# Patient Record
Sex: Female | Born: 1975 | Race: White | Hispanic: No | Smoking: Current every day smoker
Health system: Southern US, Community
[De-identification: ages and names within clinical notes are randomized; demographics above are authoritative.]

## PROBLEM LIST (undated history)

## (undated) DIAGNOSIS — J349 Unspecified disorder of nose and nasal sinuses: Secondary | ICD-10-CM

## (undated) DIAGNOSIS — N12 Tubulo-interstitial nephritis, not specified as acute or chronic: Secondary | ICD-10-CM

## (undated) DIAGNOSIS — R519 Headache, unspecified: Secondary | ICD-10-CM

## (undated) DIAGNOSIS — F410 Panic disorder [episodic paroxysmal anxiety] without agoraphobia: Secondary | ICD-10-CM

## (undated) DIAGNOSIS — N83209 Unspecified ovarian cyst, unspecified side: Secondary | ICD-10-CM

## (undated) DIAGNOSIS — J449 Chronic obstructive pulmonary disease, unspecified: Secondary | ICD-10-CM

## (undated) DIAGNOSIS — Z87891 Personal history of nicotine dependence: Secondary | ICD-10-CM

## (undated) DIAGNOSIS — I471 Supraventricular tachycardia: Secondary | ICD-10-CM

## (undated) DIAGNOSIS — Z9289 Personal history of other medical treatment: Secondary | ICD-10-CM

## (undated) DIAGNOSIS — F329 Major depressive disorder, single episode, unspecified: Secondary | ICD-10-CM

## (undated) DIAGNOSIS — I4719 Other supraventricular tachycardia: Secondary | ICD-10-CM

## (undated) DIAGNOSIS — N3010 Interstitial cystitis (chronic) without hematuria: Secondary | ICD-10-CM

## (undated) DIAGNOSIS — R001 Bradycardia, unspecified: Secondary | ICD-10-CM

## (undated) DIAGNOSIS — R51 Headache: Secondary | ICD-10-CM

## (undated) DIAGNOSIS — Z22322 Carrier or suspected carrier of Methicillin resistant Staphylococcus aureus: Secondary | ICD-10-CM

## (undated) HISTORY — DX: Bradycardia, unspecified: R00.1

## (undated) HISTORY — DX: Personal history of nicotine dependence: Z87.891

## (undated) HISTORY — DX: Personal history of other medical treatment: Z92.89

## (undated) HISTORY — PX: LAPAROSCOPY: SHX197

## (undated) HISTORY — DX: Other supraventricular tachycardia: I47.19

## (undated) HISTORY — PX: APPENDECTOMY: SHX54

## (undated) HISTORY — DX: Panic disorder (episodic paroxysmal anxiety): F41.0

## (undated) HISTORY — DX: Unspecified disorder of nose and nasal sinuses: J34.9

## (undated) HISTORY — DX: Tubulo-interstitial nephritis, not specified as acute or chronic: N12

## (undated) HISTORY — DX: Interstitial cystitis (chronic) without hematuria: N30.10

## (undated) HISTORY — DX: Unspecified ovarian cyst, unspecified side: N83.209

## (undated) HISTORY — DX: Supraventricular tachycardia: I47.1

## (undated) HISTORY — DX: Carrier or suspected carrier of methicillin resistant Staphylococcus aureus: Z22.322

---

## 2006-03-30 ENCOUNTER — Ambulatory Visit: Payer: Self-pay | Admitting: Family Medicine

## 2006-11-16 ENCOUNTER — Ambulatory Visit: Payer: Self-pay | Admitting: Family Medicine

## 2006-11-26 ENCOUNTER — Ambulatory Visit: Payer: Self-pay | Admitting: Family Medicine

## 2006-11-26 ENCOUNTER — Ambulatory Visit (HOSPITAL_COMMUNITY): Admission: RE | Admit: 2006-11-26 | Discharge: 2006-11-26 | Payer: Self-pay | Admitting: Family Medicine

## 2006-12-14 ENCOUNTER — Encounter: Payer: Self-pay | Admitting: Family Medicine

## 2006-12-14 ENCOUNTER — Ambulatory Visit: Payer: Self-pay | Admitting: Family Medicine

## 2007-02-18 ENCOUNTER — Ambulatory Visit (HOSPITAL_COMMUNITY): Admission: RE | Admit: 2007-02-18 | Discharge: 2007-02-18 | Payer: Self-pay | Admitting: Gynecology

## 2007-02-18 ENCOUNTER — Ambulatory Visit: Payer: Self-pay | Admitting: Gynecology

## 2007-02-22 ENCOUNTER — Ambulatory Visit: Payer: Self-pay | Admitting: Family Medicine

## 2007-03-04 ENCOUNTER — Ambulatory Visit (HOSPITAL_COMMUNITY): Admission: RE | Admit: 2007-03-04 | Discharge: 2007-03-04 | Payer: Self-pay | Admitting: Obstetrics & Gynecology

## 2007-03-15 ENCOUNTER — Ambulatory Visit: Payer: Self-pay | Admitting: Family Medicine

## 2007-03-29 ENCOUNTER — Ambulatory Visit: Payer: Self-pay | Admitting: Family Medicine

## 2007-04-08 ENCOUNTER — Ambulatory Visit (HOSPITAL_COMMUNITY): Admission: RE | Admit: 2007-04-08 | Discharge: 2007-04-08 | Payer: Self-pay | Admitting: Family Medicine

## 2007-04-12 ENCOUNTER — Ambulatory Visit: Payer: Self-pay | Admitting: Family Medicine

## 2007-05-03 ENCOUNTER — Ambulatory Visit: Payer: Self-pay | Admitting: Family Medicine

## 2007-05-06 ENCOUNTER — Ambulatory Visit (HOSPITAL_COMMUNITY): Admission: RE | Admit: 2007-05-06 | Discharge: 2007-05-06 | Payer: Self-pay | Admitting: Family Medicine

## 2007-05-20 ENCOUNTER — Ambulatory Visit (HOSPITAL_COMMUNITY): Admission: RE | Admit: 2007-05-20 | Discharge: 2007-05-20 | Payer: Self-pay | Admitting: Gynecology

## 2007-05-24 ENCOUNTER — Ambulatory Visit: Payer: Self-pay | Admitting: Family Medicine

## 2007-06-03 ENCOUNTER — Ambulatory Visit (HOSPITAL_COMMUNITY): Admission: RE | Admit: 2007-06-03 | Discharge: 2007-06-03 | Payer: Self-pay | Admitting: Gynecology

## 2007-06-21 ENCOUNTER — Ambulatory Visit: Payer: Self-pay | Admitting: Family Medicine

## 2007-07-12 ENCOUNTER — Ambulatory Visit: Payer: Self-pay | Admitting: Family Medicine

## 2007-08-02 ENCOUNTER — Ambulatory Visit: Payer: Self-pay | Admitting: Family Medicine

## 2007-08-05 ENCOUNTER — Encounter: Admission: RE | Admit: 2007-08-05 | Discharge: 2007-08-05 | Payer: Self-pay | Admitting: Obstetrics & Gynecology

## 2007-08-09 ENCOUNTER — Ambulatory Visit: Payer: Self-pay | Admitting: Family Medicine

## 2007-08-23 ENCOUNTER — Ambulatory Visit: Payer: Self-pay | Admitting: Family Medicine

## 2007-08-26 ENCOUNTER — Ambulatory Visit (HOSPITAL_COMMUNITY): Admission: RE | Admit: 2007-08-26 | Discharge: 2007-08-26 | Payer: Self-pay | Admitting: Obstetrics & Gynecology

## 2007-08-29 ENCOUNTER — Ambulatory Visit: Payer: Self-pay | Admitting: Gynecology

## 2007-09-01 ENCOUNTER — Ambulatory Visit: Payer: Self-pay | Admitting: Obstetrics & Gynecology

## 2007-09-07 ENCOUNTER — Ambulatory Visit: Payer: Self-pay | Admitting: Obstetrics & Gynecology

## 2007-09-12 ENCOUNTER — Ambulatory Visit: Payer: Self-pay | Admitting: Obstetrics & Gynecology

## 2007-09-13 ENCOUNTER — Encounter: Payer: Self-pay | Admitting: Family Medicine

## 2007-09-13 LAB — CONVERTED CEMR LAB: Chlamydia, DNA Probe: NEGATIVE

## 2007-09-19 ENCOUNTER — Ambulatory Visit: Payer: Self-pay | Admitting: Gynecology

## 2007-09-26 ENCOUNTER — Ambulatory Visit: Payer: Self-pay | Admitting: Gynecology

## 2007-10-03 ENCOUNTER — Ambulatory Visit: Payer: Self-pay | Admitting: Obstetrics & Gynecology

## 2007-10-10 ENCOUNTER — Ambulatory Visit: Payer: Self-pay | Admitting: Obstetrics & Gynecology

## 2007-10-10 ENCOUNTER — Inpatient Hospital Stay (HOSPITAL_COMMUNITY): Admission: RE | Admit: 2007-10-10 | Discharge: 2007-10-13 | Payer: Self-pay | Admitting: Gynecology

## 2007-10-17 ENCOUNTER — Ambulatory Visit: Payer: Self-pay | Admitting: Cardiology

## 2007-10-17 ENCOUNTER — Ambulatory Visit: Payer: Self-pay | Admitting: Obstetrics and Gynecology

## 2007-10-17 ENCOUNTER — Encounter: Payer: Self-pay | Admitting: Obstetrics & Gynecology

## 2007-10-17 ENCOUNTER — Observation Stay (HOSPITAL_COMMUNITY): Admission: EM | Admit: 2007-10-17 | Discharge: 2007-10-19 | Payer: Self-pay | Admitting: Cardiology

## 2007-10-18 ENCOUNTER — Encounter: Payer: Self-pay | Admitting: Cardiology

## 2007-11-16 ENCOUNTER — Ambulatory Visit: Payer: Self-pay | Admitting: Cardiology

## 2007-11-23 ENCOUNTER — Ambulatory Visit: Payer: Self-pay | Admitting: Gynecology

## 2007-11-23 ENCOUNTER — Encounter (INDEPENDENT_AMBULATORY_CARE_PROVIDER_SITE_OTHER): Payer: Self-pay | Admitting: Gynecology

## 2008-05-24 ENCOUNTER — Ambulatory Visit: Payer: Self-pay | Admitting: Cardiology

## 2008-06-22 ENCOUNTER — Inpatient Hospital Stay (HOSPITAL_COMMUNITY): Admission: AD | Admit: 2008-06-22 | Discharge: 2008-06-22 | Payer: Self-pay | Admitting: Obstetrics & Gynecology

## 2008-10-11 ENCOUNTER — Ambulatory Visit: Payer: Self-pay | Admitting: Obstetrics & Gynecology

## 2008-10-12 ENCOUNTER — Encounter: Payer: Self-pay | Admitting: Family Medicine

## 2008-10-12 LAB — CONVERTED CEMR LAB
Antibody Screen: NEGATIVE
Basophils Absolute: 0 10*3/uL (ref 0.0–0.1)
Eosinophils Absolute: 0 10*3/uL (ref 0.0–0.7)
Eosinophils Relative: 1 % (ref 0–5)
Hepatitis B Surface Ag: NEGATIVE
MCHC: 33.3 g/dL (ref 30.0–36.0)
Monocytes Absolute: 0.4 10*3/uL (ref 0.1–1.0)
Monocytes Relative: 9 % (ref 3–12)
Neutro Abs: 3 10*3/uL (ref 1.7–7.7)
RDW: 12.7 % (ref 11.5–15.5)

## 2008-10-18 ENCOUNTER — Encounter: Payer: Self-pay | Admitting: Family Medicine

## 2008-10-18 ENCOUNTER — Ambulatory Visit: Payer: Self-pay | Admitting: Obstetrics & Gynecology

## 2008-10-18 LAB — CONVERTED CEMR LAB: GC Probe Amp, Urine: NEGATIVE

## 2008-10-29 ENCOUNTER — Ambulatory Visit: Payer: Self-pay | Admitting: Family Medicine

## 2008-11-12 IMAGING — CT CT ANGIO CHEST
3 of 4 series · 18 of 30 positions shown · IV contrast (150ml omni/300%)
Comparison: none

CLINICAL DATA: 31-year-old female, six days postpartum with shortness of breath.
CT ANGIOGRAPHY OF CHEST:
TECHNIQUE: Multidetector CT imaging of the chest was performed during bolus injection of intravenous contrast.  Multiplanar CT angiographic image reconstructions were generated to evaluate the vascular anatomy.
Contrast:  150 cc Omnipaque 300.

[Series 4: recon 3: pe chest · axial · 0.70mm/px · z∈[-270,-30]mm · 12 of 288 slices shown]
[im 24/288  lung]
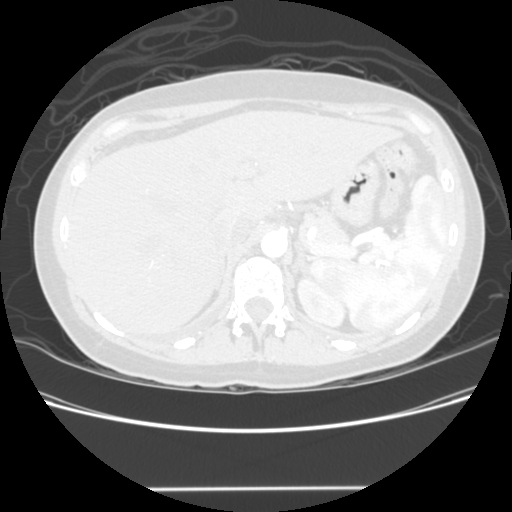
[im 48/288  mediastinal]
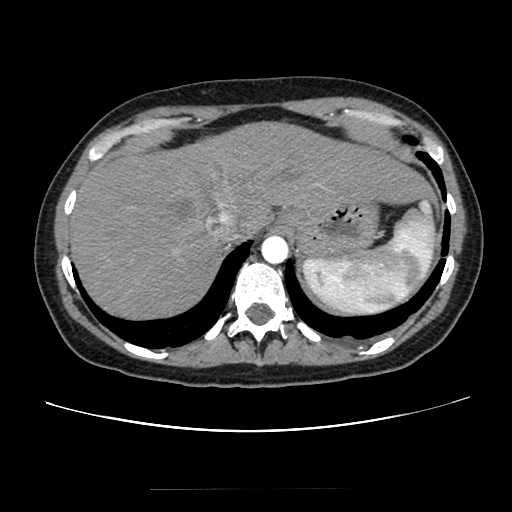
[im 72/288  lung]
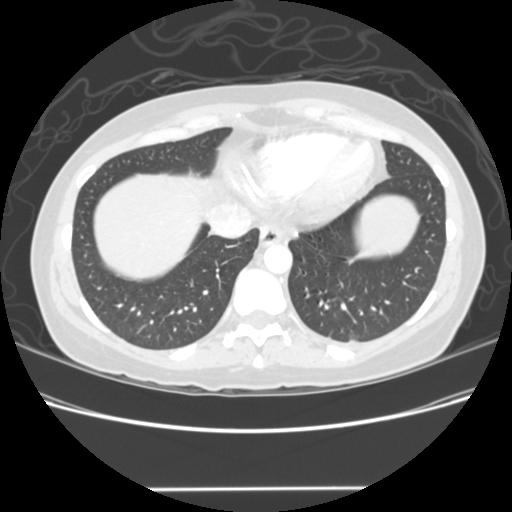
[im 96/288  mediastinal]
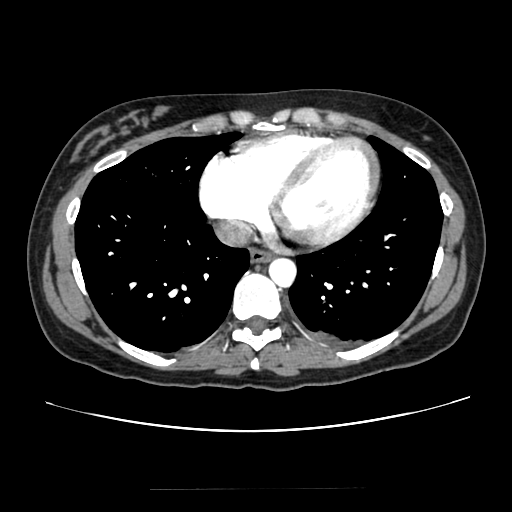
[im 120/288  lung]
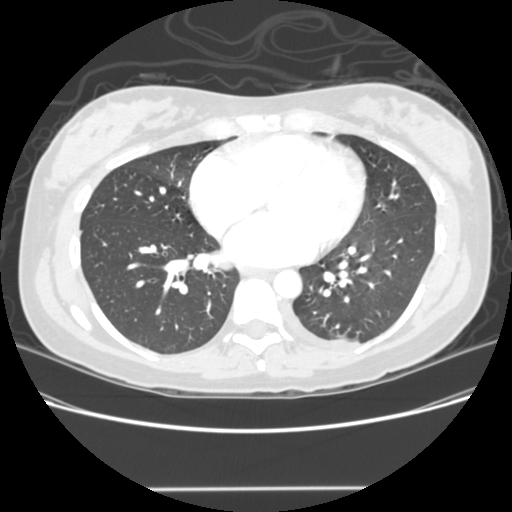
[im 144/288  mediastinal]
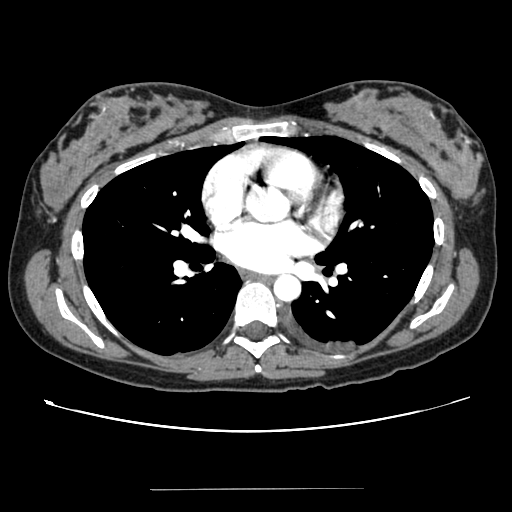
[im 155/288  lung]
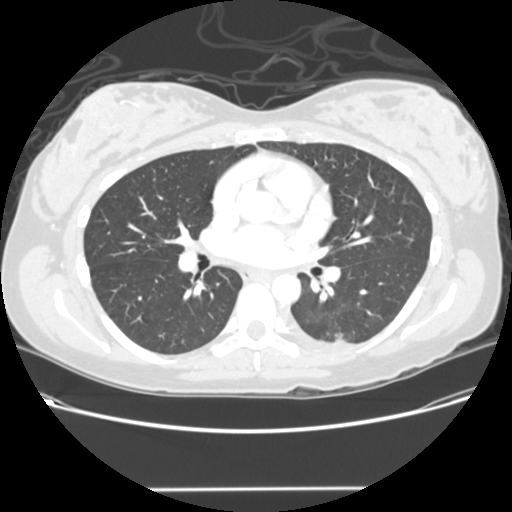
[im 168/288  mediastinal]
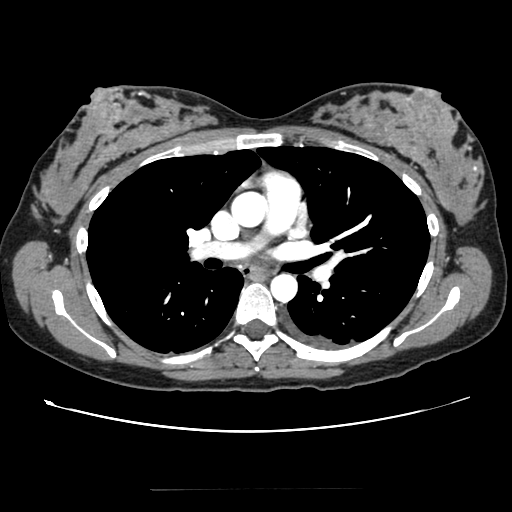
[im 192/288  lung]
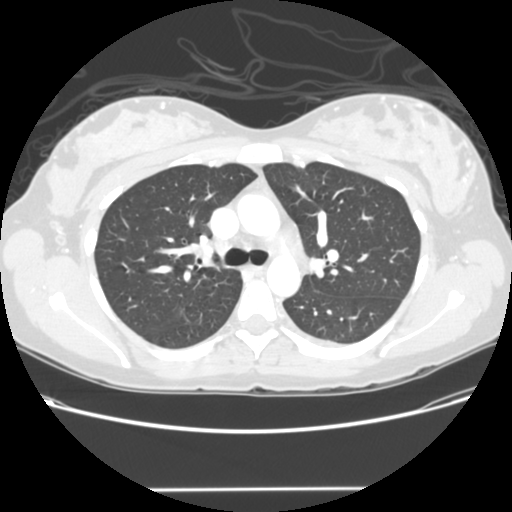
[im 216/288  mediastinal]
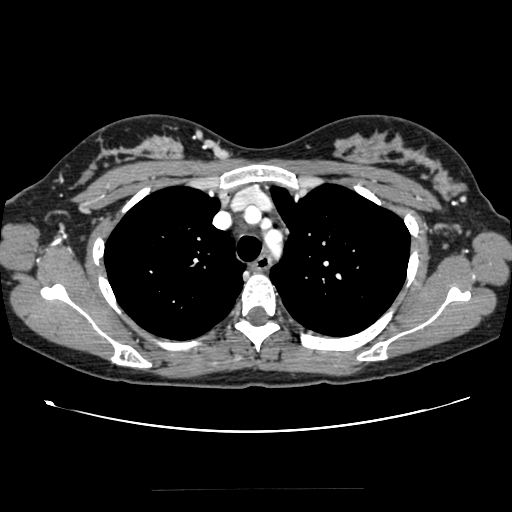
[im 240/288  lung]
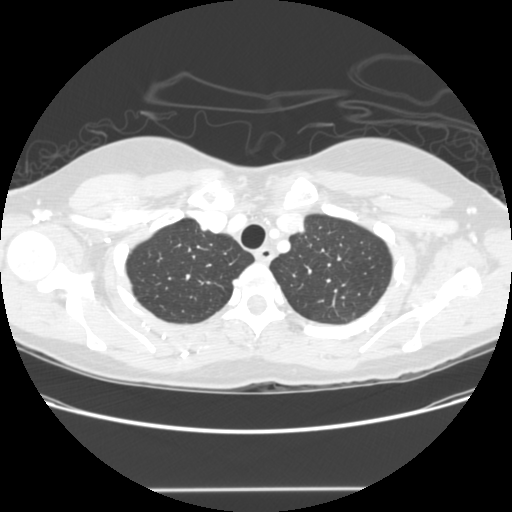
[im 264/288  mediastinal]
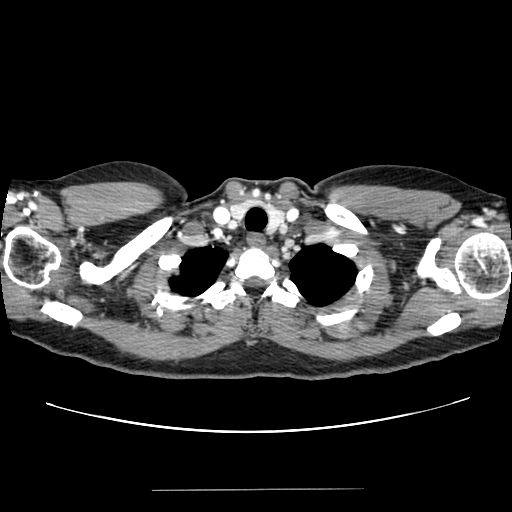

[Series 400: reformatted · coronal · 0.61mm/px · 2 of 102 slices shown (1 of 2)]
[im 34/102  lung]
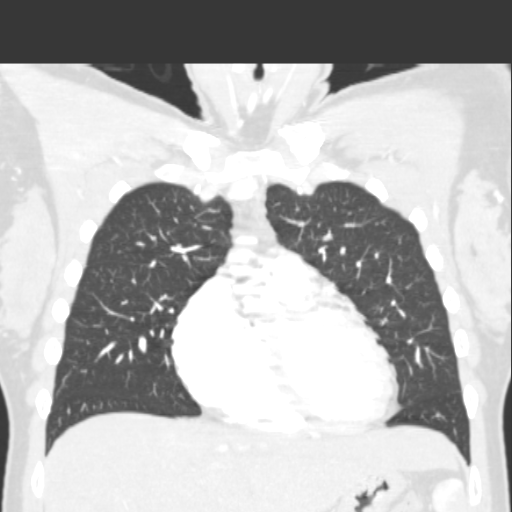
[im 68/102  lung]
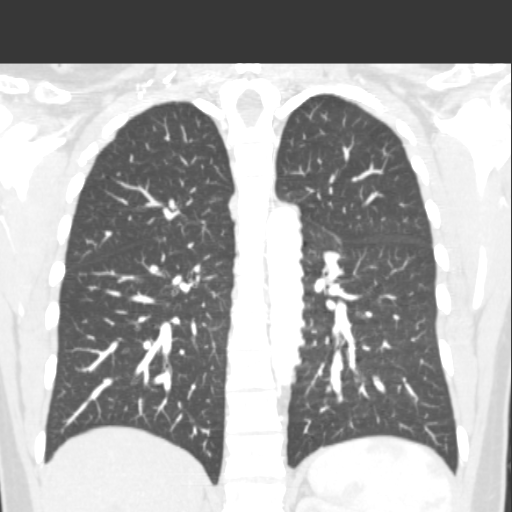

[Series 401: reformatted · sagittal · 0.61mm/px · 4 of 139 slices shown (2 of 2)]
[im 28/139  lung]
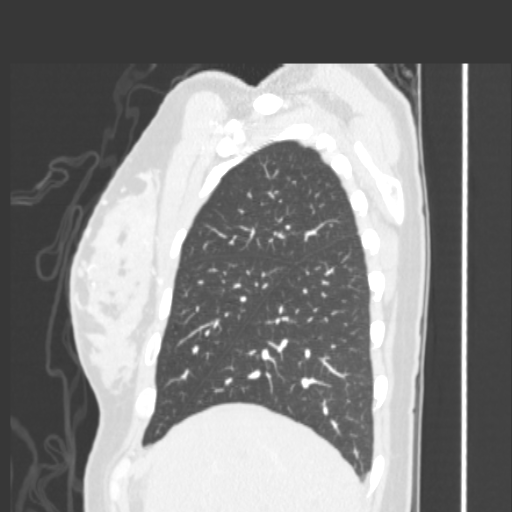
[im 56/139  lung]
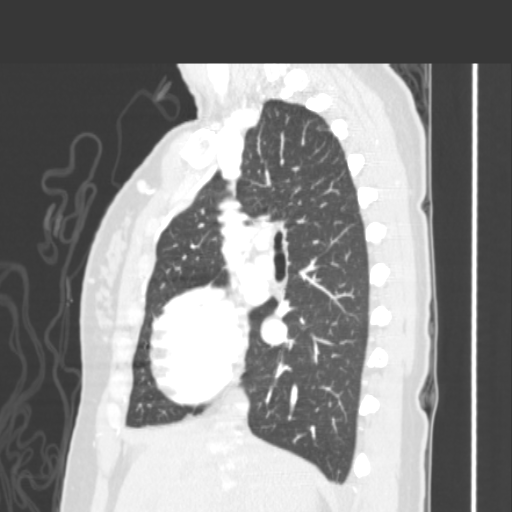
[im 83/139  lung]
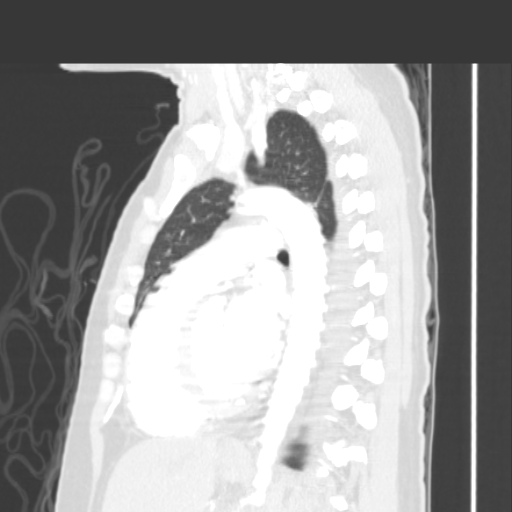
[im 111/139  lung]
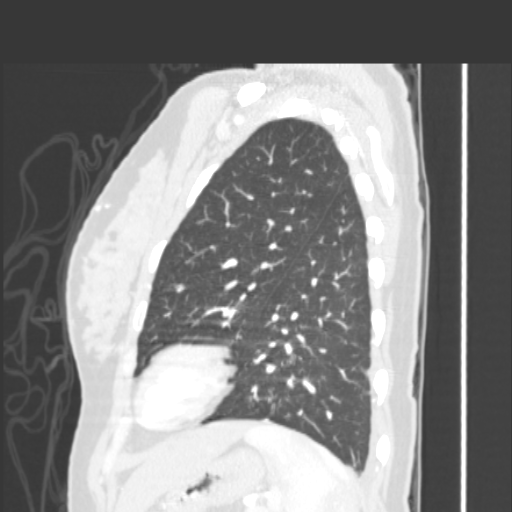

[18 of 30 positions shown; findings below may reference images not displayed]

FINDINGS: The study is technically suboptimal in that the pulmonary arteries are imaged at a delayed time with greater contrast in the aorta.  No evidence of filling defects within the main pulmonary arteries and segmental pulmonary arteries.  The subsegmental arteries are poorly opacified.  
Review of the lung parenchymal demonstrates mild dependent atelectasis at the left lung base and a small left effusion.  No evidence focal consolidation or pneumothorax. 
No evidence of mediastinal or hilar lymphadenopathy.  
Limited view of the upper abdomen is unremarkable.
IMPRESSION: 1.  No evidence of acute pulmonary embolism in study somewhat limited by poor timing. 
2.  Mild left basilar atelectasis and trace effusion.

## 2008-11-15 ENCOUNTER — Encounter: Payer: Self-pay | Admitting: Family Medicine

## 2008-11-15 ENCOUNTER — Ambulatory Visit: Payer: Self-pay | Admitting: Family Medicine

## 2008-11-29 ENCOUNTER — Ambulatory Visit: Payer: Self-pay | Admitting: Family Medicine

## 2008-11-30 ENCOUNTER — Ambulatory Visit (HOSPITAL_COMMUNITY): Admission: RE | Admit: 2008-11-30 | Discharge: 2008-11-30 | Payer: Self-pay | Admitting: Family Medicine

## 2008-12-13 ENCOUNTER — Ambulatory Visit: Payer: Self-pay | Admitting: Family Medicine

## 2008-12-28 ENCOUNTER — Ambulatory Visit (HOSPITAL_COMMUNITY): Admission: RE | Admit: 2008-12-28 | Discharge: 2008-12-28 | Payer: Self-pay | Admitting: Family Medicine

## 2009-01-03 ENCOUNTER — Ambulatory Visit: Payer: Self-pay | Admitting: Obstetrics & Gynecology

## 2009-01-11 ENCOUNTER — Ambulatory Visit (HOSPITAL_COMMUNITY): Admission: RE | Admit: 2009-01-11 | Discharge: 2009-01-11 | Payer: Self-pay | Admitting: Family Medicine

## 2009-01-31 ENCOUNTER — Encounter: Payer: Self-pay | Admitting: Family Medicine

## 2009-01-31 ENCOUNTER — Ambulatory Visit: Payer: Self-pay | Admitting: Obstetrics and Gynecology

## 2009-02-28 ENCOUNTER — Ambulatory Visit: Payer: Self-pay | Admitting: Obstetrics and Gynecology

## 2009-02-28 LAB — CONVERTED CEMR LAB: Yeast Wet Prep HPF POC: NONE SEEN

## 2009-03-21 ENCOUNTER — Encounter: Payer: Self-pay | Admitting: Family Medicine

## 2009-03-21 ENCOUNTER — Ambulatory Visit: Payer: Self-pay | Admitting: Obstetrics & Gynecology

## 2009-03-21 LAB — CONVERTED CEMR LAB
HCT: 33.7 % — ABNORMAL LOW (ref 36.0–46.0)
Hemoglobin: 10.6 g/dL — ABNORMAL LOW (ref 12.0–15.0)
MCHC: 31.5 g/dL (ref 30.0–36.0)
RDW: 14.6 % (ref 11.5–15.5)
WBC: 6.3 10*3/uL (ref 4.0–10.5)

## 2009-04-04 ENCOUNTER — Ambulatory Visit: Payer: Self-pay | Admitting: Obstetrics and Gynecology

## 2009-04-05 ENCOUNTER — Encounter: Payer: Self-pay | Admitting: Family Medicine

## 2009-04-18 ENCOUNTER — Ambulatory Visit: Payer: Self-pay | Admitting: Obstetrics & Gynecology

## 2009-05-02 ENCOUNTER — Ambulatory Visit: Payer: Self-pay | Admitting: Obstetrics and Gynecology

## 2009-05-16 ENCOUNTER — Ambulatory Visit: Payer: Self-pay | Admitting: Obstetrics & Gynecology

## 2009-05-16 ENCOUNTER — Encounter: Payer: Self-pay | Admitting: Obstetrics & Gynecology

## 2009-05-16 LAB — CONVERTED CEMR LAB: Chlamydia, DNA Probe: NEGATIVE

## 2009-05-17 ENCOUNTER — Encounter: Payer: Self-pay | Admitting: Obstetrics & Gynecology

## 2009-05-23 ENCOUNTER — Ambulatory Visit: Payer: Self-pay | Admitting: Obstetrics & Gynecology

## 2009-05-25 ENCOUNTER — Ambulatory Visit: Payer: Self-pay | Admitting: Advanced Practice Midwife

## 2009-05-25 ENCOUNTER — Inpatient Hospital Stay (HOSPITAL_COMMUNITY): Admission: AD | Admit: 2009-05-25 | Discharge: 2009-05-25 | Payer: Self-pay | Admitting: Obstetrics & Gynecology

## 2009-05-30 ENCOUNTER — Ambulatory Visit: Payer: Self-pay | Admitting: Obstetrics & Gynecology

## 2009-06-05 ENCOUNTER — Inpatient Hospital Stay (HOSPITAL_COMMUNITY): Admission: AD | Admit: 2009-06-05 | Discharge: 2009-06-07 | Payer: Self-pay | Admitting: Obstetrics & Gynecology

## 2009-06-05 ENCOUNTER — Ambulatory Visit: Payer: Self-pay | Admitting: Obstetrics and Gynecology

## 2009-06-08 DIAGNOSIS — N12 Tubulo-interstitial nephritis, not specified as acute or chronic: Secondary | ICD-10-CM | POA: Insufficient documentation

## 2009-06-08 DIAGNOSIS — N83209 Unspecified ovarian cyst, unspecified side: Secondary | ICD-10-CM

## 2009-06-08 DIAGNOSIS — N809 Endometriosis, unspecified: Secondary | ICD-10-CM | POA: Insufficient documentation

## 2009-06-08 DIAGNOSIS — Z8659 Personal history of other mental and behavioral disorders: Secondary | ICD-10-CM | POA: Insufficient documentation

## 2009-06-08 DIAGNOSIS — N3010 Interstitial cystitis (chronic) without hematuria: Secondary | ICD-10-CM | POA: Insufficient documentation

## 2009-06-08 DIAGNOSIS — Z87891 Personal history of nicotine dependence: Secondary | ICD-10-CM

## 2009-06-08 HISTORY — DX: Tubulo-interstitial nephritis, not specified as acute or chronic: N12

## 2009-06-12 ENCOUNTER — Encounter: Payer: Self-pay | Admitting: Physician Assistant

## 2009-06-12 ENCOUNTER — Ambulatory Visit: Payer: Self-pay | Admitting: Internal Medicine

## 2009-06-12 ENCOUNTER — Ambulatory Visit: Payer: Self-pay

## 2009-06-12 DIAGNOSIS — R002 Palpitations: Secondary | ICD-10-CM | POA: Insufficient documentation

## 2009-06-12 DIAGNOSIS — I498 Other specified cardiac arrhythmias: Secondary | ICD-10-CM | POA: Insufficient documentation

## 2009-06-12 DIAGNOSIS — R0989 Other specified symptoms and signs involving the circulatory and respiratory systems: Secondary | ICD-10-CM

## 2009-06-19 LAB — CONVERTED CEMR LAB: BUN: 14 mg/dL (ref 6–23)

## 2009-06-27 ENCOUNTER — Ambulatory Visit: Payer: Self-pay | Admitting: Cardiology

## 2009-06-27 ENCOUNTER — Ambulatory Visit: Payer: Self-pay

## 2009-07-11 ENCOUNTER — Ambulatory Visit: Payer: Self-pay | Admitting: Cardiology

## 2009-07-11 ENCOUNTER — Ambulatory Visit: Payer: Self-pay

## 2009-07-11 ENCOUNTER — Encounter: Payer: Self-pay | Admitting: Cardiology

## 2009-07-11 ENCOUNTER — Ambulatory Visit (HOSPITAL_COMMUNITY): Admission: RE | Admit: 2009-07-11 | Discharge: 2009-07-11 | Payer: Self-pay | Admitting: Cardiology

## 2009-07-11 ENCOUNTER — Encounter (HOSPITAL_COMMUNITY): Admission: RE | Admit: 2009-07-11 | Discharge: 2009-09-17 | Payer: Self-pay | Admitting: Cardiology

## 2009-07-16 ENCOUNTER — Ambulatory Visit: Payer: Self-pay | Admitting: Family Medicine

## 2009-07-16 ENCOUNTER — Telehealth: Payer: Self-pay | Admitting: Cardiology

## 2009-07-17 ENCOUNTER — Encounter: Payer: Self-pay | Admitting: Cardiology

## 2009-07-25 ENCOUNTER — Ambulatory Visit: Payer: Self-pay | Admitting: Obstetrics & Gynecology

## 2009-08-29 ENCOUNTER — Ambulatory Visit: Payer: Self-pay | Admitting: Family Medicine

## 2009-09-03 ENCOUNTER — Telehealth: Payer: Self-pay | Admitting: Cardiology

## 2009-11-07 ENCOUNTER — Ambulatory Visit: Payer: Self-pay | Admitting: Obstetrics & Gynecology

## 2009-11-07 LAB — CONVERTED CEMR LAB: Chlamydia, DNA Probe: NEGATIVE

## 2009-11-08 ENCOUNTER — Encounter: Payer: Self-pay | Admitting: Obstetrics & Gynecology

## 2009-11-08 LAB — CONVERTED CEMR LAB: Clue Cells Wet Prep HPF POC: NONE SEEN

## 2010-10-08 ENCOUNTER — Ambulatory Visit
Admission: RE | Admit: 2010-10-08 | Discharge: 2010-10-08 | Payer: Self-pay | Source: Home / Self Care | Attending: Family Medicine | Admitting: Family Medicine

## 2010-10-08 DIAGNOSIS — F17200 Nicotine dependence, unspecified, uncomplicated: Secondary | ICD-10-CM | POA: Insufficient documentation

## 2010-10-13 ENCOUNTER — Telehealth (INDEPENDENT_AMBULATORY_CARE_PROVIDER_SITE_OTHER): Payer: Self-pay

## 2010-10-23 NOTE — Progress Notes (Signed)
  Phone Note Refill Request   Refills Requested: Medication #1:  FLUCONAZOLE 150 MG TABS take 1 by mouth x 1.   Dosage confirmed as above?Dosage Confirmed   Supply Requested: 1 tab  Method Requested: Electronic Initial call taken by: Levonne Spiller EMT-P,  October 13, 2010 2:09 PM Caller: Patient Reason for Call: Refill Medication Summary of Call: Patient called stating she need a refill for Fluconazole.  She stated she is having problem with the yeast infestion.  She said it is not better or doing worse.  The patient was taking the Fluconazole and the antibiotic but she is still have problems.  I talked to Dr. Laural Benes and he stated that the patient should have taken the antibiotics first and then take the Fluconazole.  He advise the patient to eat yogurt and take probiotics over the counter.  We will refill the Fluconazole but Dr. Laural Benes wants the patient to finish the antibiotics and then take the Fluconazole about she is done.  I gave these instructions to the patient and stated she understood what she needed to do.  I advise her to call us back if she has any problems after she had followed the instructions from the doctor.  Initial call taken by: Dorna Leitz,  October 13, 2010 2:04 PM    Prescriptions: FLUCONAZOLE 150 MG TABS (FLUCONAZOLE) take 1 by mouth x 1  #1 x 0   Entered by:   Levonne Spiller EMT-P   Authorized by:   Standley Dakins MD   Signed by:   Levonne Spiller EMT-P on 10/13/2010   Method used:   Electronically to        [facility] [address] (retail)       [address], [address]       [place], [place]  [address]       Ph: [phone]       Fax: [phone]   RxID:   [id number]   Appended Document:  Pt. was notified of Rx for Fluconazole 150mg  tab #1 NRF. / rwt

## 2010-10-23 NOTE — Assessment & Plan Note (Signed)
Summary: sinus infection/jbb   Vital Signs:  Patient Profile:   35 Years Old Female CC:      Possible Sinus Infection Height:     64 inches Weight:      150 pounds BMI:     25.84 O2 Sat:      100 % O2 treatment:    Room Air Temp:     97.7 degrees F oral Pulse rate:   96 / minute Pulse rhythm:   regular Resp:     18 per minute BP sitting:   127 / 74  (right arm)  Pt. in pain?   yes    Location:   head    Intensity:   9    Type:       aching  Vitals Entered By: Levonne Spiller EMT-P (October 08, 2010 1:28 PM)              Is Patient Diabetic? No  Does patient need assistance? Functional Status Self care Ambulation Normal Comments Pt. is  a smoker. half pack per day.      Current Allergies: ! PCN ! SULFA ! * RED FOOD COLORHistory of Present Illness History from: patient Reason for visit: see chief complaint Chief Complaint: Possible Sinus Infection History of Present Illness: This patient presented today because she's been concerned about a sinus infection. She reports that she gets a sinus infection 2 times per year. She reports that she works at a [employer] and she did an x-ray of her sinuses and it revealed that she had fluid present in the right maxillary sinus. She reports that she normally does get infections in the right maxillary sinus. she is having sinus pressure and pain and taking over-the-counter Mucinex D with only minimal relief in symptoms. She also took some Afrin nasal spray to try and open up her sinuses and allow them to drain which it has done effectively. She denies fever chills nausea and vomiting. She is allergic to sulfur and penicillin. The patient also reports dental pain.  REVIEW OF SYSTEMS Constitutional Symptoms      Denies fever, chills, night sweats, weight loss, weight gain, and fatigue.  Eyes       Denies change in vision, eye pain, eye discharge, glasses, contact lenses, and eye surgery. Ear/Nose/Throat/Mouth       Complains of  frequent runny nose, sinus problems, and tooth pain or bleeding.      Denies hearing loss/aids, change in hearing, ear pain, ear discharge, dizziness, frequent nose bleeds, sore throat, and hoarseness.      Comments: Tooth Pain Respiratory       Complains of productive cough.      Denies dry cough, wheezing, shortness of breath, asthma, bronchitis, and emphysema/COPD.      Comments: Minor Productive Cough, Colored Sputum Cardiovascular       Denies murmurs, chest pain, and tires easily with exhertion.    Gastrointestinal       Denies stomach pain, nausea/vomiting, diarrhea, constipation, blood in bowel movements, and indigestion. Genitourniary       Denies painful urination, blood or discharge from vagina, kidney stones, and loss of urinary control. Neurological       Denies paralysis, seizures, and fainting/blackouts. Musculoskeletal       Denies muscle pain, joint pain, joint stiffness, decreased range of motion, redness, swelling, muscle weakness, and gout.  Skin       Denies bruising, unusual mles/lumps or sores, and hair/skin or nail changes.  Psych  Denies mood changes, temper/anger issues, anxiety/stress, speech problems, depression, and sleep problems.  Past History:  Family History: Last updated: 06/08/2009  Mother is alive and well.  Father is alive, but she  does not know much about his family history.  There is no obvious cancer  in the family.      Social History: Last updated: 10/08/2010  She has been a smoker for 11 years and started smoking related to the tension of her first  marriage apparently and two children.  She has been a Copywriter, advertising.  non-drinker, no drug abuse  Risk Factors: Smoking Status: current (07/11/2009)  Past Medical History: Current Problems:  TOBACCO ABUSE, HX OF (ICD-V15.82) PYELONEPHRITIS (ICD-590.80) OVARIAN CYST (ICD-620.2) INTERSTITIAL CYSTITIS (ICD-595.1) PANIC DISORDER, HX OF (ICD-V11.8) ENDOMETRIOSIS  (ICD-617.9) BRADYCARDIA HX OF Right Maxillary Sinus Problems (Frequent)  Past Surgical History: Reviewed history from 06/08/2009 and no changes required.  1. Appendectomy.   2. Laparoscopy.        Family History: Reviewed history from 06/08/2009 and no changes required.  Mother is alive and well.  Father is alive, but she  does not know much about his family history.  There is no obvious cancer  in the family.      Social History:  She has been a smoker for 11 years and started smoking related to the tension of her first  marriage apparently and two children.  She has been a Copywriter, advertising.  non-drinker, no drug abuse Physical Exam General appearance: well developed, well nourished, no acute distress Head: normocephalic, atraumatic Eyes: conjunctivae and lids normal Pupils: equal, round, reactive to light Ears: normal, no lesions or deformities Nasal: marked sinus and nasal congestion, thick yellow discharge, mild swelling around the external nose on right Oral/Pharynx: tongue normal, posterior pharynx without erythema or exudate Neck: neck supple,  trachea midline, no masses Chest/Lungs: no rales, wheezes, or rhonchi bilateral, breath sounds equal without effort Heart: regular rate and  rhythm, no murmur Abdomen: soft, non-tender without obvious organomegaly Extremities: normal extremities Neurological: grossly intact and non-focal Skin: no obvious rashes or lesions MSE: oriented to time, place, and person Assessment Problems:   PALPITATIONS (ICD-785.1) BRADYCARDIA (ICD-427.89) CAROTID BRUIT (ICD-785.9) TOBACCO ABUSE, HX OF (ICD-V15.82) PYELONEPHRITIS (ICD-590.80) OVARIAN CYST (ICD-620.2) INTERSTITIAL CYSTITIS (ICD-595.1) PANIC DISORDER, HX OF (ICD-V11.8) ENDOMETRIOSIS (ICD-617.9) New Problems: ACUTE MAXILLARY SINUSITIS (ICD-461.0) CIGARETTE SMOKER (ICD-305.1)   Patient Education: Patient and/or caregiver instructed in the following: rest, Tylenol prn, quit  smoking. The risks, benefits and possible side effects were clearly explained and discussed with the patient.  The patient verbalized clear understanding.  The patient was given instructions to return if symptoms don't improve, worsen or new changes develop.  If it is not during clinic hours and the patient cannot get back to this clinic then the patient was told to seek medical care at an available urgent care or emergency department.  The patient verbalized understanding.   Demonstrates willingness to comply.  Plan New Medications/Changes: FLUCONAZOLE 150 MG TABS (FLUCONAZOLE) take 1 by mouth x 1  #1 x 0, 10/08/2010, Clanford Johnson MD DOXYCYCLINE HYCLATE 100 MG TABS (DOXYCYCLINE HYCLATE) take 1 by mouth two times a day with food until completed  #20 x 0, 10/08/2010, Clanford Johnson MD FLUTICASONE PROPIONATE 50 MCG/ACT SUSP (FLUTICASONE PROPIONATE) 2 sprays per nostril once daily  #1 x 0, 10/08/2010, Standley Dakins MD  Planning Comments:   The patient was counseled and advised to stop using all tobacco products.  Medical assistance was offered and the  patient was encouraged to call 1-800-QUIT-NOW to get a smoking cessation coach.    Return or go to the ER if no improvement or symptoms getting worse.   Afrin use for 3 days max.  The patient verbalized clear understanding.   Follow Up: Follow up in 2-3 days if no improvement, Follow up on an as needed basis, Follow up with Primary Physician  The patient and/or caregiver has been counseled thoroughly with regard to medications prescribed including dosage, schedule, interactions, rationale for use, and possible side effects and they verbalize understanding.  Diagnoses and expected course of recovery discussed and will return if not improved as expected or if the condition worsens. Patient and/or caregiver verbalized understanding.  Prescriptions: FLUCONAZOLE 150 MG TABS (FLUCONAZOLE) take 1 by mouth x 1  #1 x 0   Entered and Authorized by:    Standley Dakins MD   Signed by:   Standley Dakins MD on 10/08/2010   Method used:   Electronically to        [facility] (retail)       [address], [address]       [place], [place]  [address]       Ph: [phone]       Fax: [phone]   RxID:   [id number] DOXYCYCLINE HYCLATE 100 MG TABS (DOXYCYCLINE HYCLATE) take 1 by mouth two times a day with food until completed  #20 x 0   Entered and Authorized by:   Standley Dakins MD   Signed by:   Standley Dakins MD on 10/08/2010   Method used:   Electronically to        [facility] (retail)       [address], [address]       [place], [place]  [address]       Ph: [phone]       Fax: [phone]   RxID:   [id number] FLUTICASONE PROPIONATE 50 MCG/ACT SUSP (FLUTICASONE PROPIONATE) 2 sprays per nostril once daily  #1 x 0   Entered and Authorized by:   Standley Dakins MD   Signed by:   Standley Dakins MD on 10/08/2010   Method used:   Electronically to        [facility] (retail)       [address], [address]       [place], [place]  [address]       Ph: [phone]       Fax: [phone]   RxID:   [id number]   Patient Instructions: 1)  Go to the pharmacy and pick up your prescription (s).  It may take up to 30 mins for electronic prescriptions to be delivered to the pharmacy.  Please call if your pharmacy has not received your prescriptions after 30 minutes.   2)  Take your antibiotic as prescribed until ALL of it is gone, but stop if you develop a rash or swelling and contact our office as soon as possible. 3)  Acute sinusitis symptoms for less than 10 days are not helped by antibiotics.Use warm moist compresses, and over the counter decongestants ( only as directed). Call if no improvement in 5-7 days, sooner if increasing pain, fever, or new symptoms. 4)  Tobacco is very bad for your  health and your loved ones! You  Should stop smoking!. 5)  Stop Smoking Tips: Choose a Quit date. Cut down before the Quit date. decide what you will do as a substitute when you feel the urge to smoke(gum,toothpick,exercise). 6)  The patient was informed that there is no on-call provider or services available at this clinic during off-hours (when the clinic is closed).  If the patient developed a problem or concern that required immediate attention, the patient was advised to go the the nearest available urgent care or emergency department for medical care.  The patient verbalized understanding.

## 2010-11-06 ENCOUNTER — Other Ambulatory Visit: Payer: Self-pay | Admitting: Obstetrics and Gynecology

## 2010-11-06 ENCOUNTER — Ambulatory Visit (INDEPENDENT_AMBULATORY_CARE_PROVIDER_SITE_OTHER): Payer: PRIVATE HEALTH INSURANCE | Admitting: Obstetrics & Gynecology

## 2010-11-06 ENCOUNTER — Encounter: Payer: Self-pay | Admitting: Obstetrics and Gynecology

## 2010-11-06 DIAGNOSIS — N83209 Unspecified ovarian cyst, unspecified side: Secondary | ICD-10-CM

## 2010-11-06 DIAGNOSIS — Z1272 Encounter for screening for malignant neoplasm of vagina: Secondary | ICD-10-CM

## 2010-11-06 DIAGNOSIS — N949 Unspecified condition associated with female genital organs and menstrual cycle: Secondary | ICD-10-CM

## 2010-11-06 DIAGNOSIS — Z01419 Encounter for gynecological examination (general) (routine) without abnormal findings: Secondary | ICD-10-CM

## 2010-11-06 DIAGNOSIS — Z975 Presence of (intrauterine) contraceptive device: Secondary | ICD-10-CM

## 2010-11-06 DIAGNOSIS — Z113 Encounter for screening for infections with a predominantly sexual mode of transmission: Secondary | ICD-10-CM

## 2010-11-06 LAB — CONVERTED CEMR LAB

## 2010-11-14 ENCOUNTER — Ambulatory Visit (HOSPITAL_COMMUNITY)
Admission: RE | Admit: 2010-11-14 | Discharge: 2010-11-14 | Disposition: A | Discharge status: Home / Self Care | Payer: PRIVATE HEALTH INSURANCE | Source: Ambulatory Visit | Attending: Obstetrics and Gynecology | Admitting: Obstetrics and Gynecology

## 2010-11-14 DIAGNOSIS — N83209 Unspecified ovarian cyst, unspecified side: Secondary | ICD-10-CM

## 2010-11-14 DIAGNOSIS — Z30431 Encounter for routine checking of intrauterine contraceptive device: Secondary | ICD-10-CM | POA: Insufficient documentation

## 2010-11-14 DIAGNOSIS — Z975 Presence of (intrauterine) contraceptive device: Secondary | ICD-10-CM

## 2010-11-24 ENCOUNTER — Ambulatory Visit: Payer: PRIVATE HEALTH INSURANCE | Admitting: Obstetrics & Gynecology

## 2010-11-24 DIAGNOSIS — N814 Uterovaginal prolapse, unspecified: Secondary | ICD-10-CM

## 2010-12-12 NOTE — Assessment & Plan Note (Unsigned)
NAME:  Sarah, Mcpherson NO.:  [id number]  MEDICAL RECORD NO.:  [id number]           PATIENT TYPE:  LOCATION:  [facility]           FACILITY:  PHYSICIAN:  Argentina Donovan, MD        DATE OF BIRTH:  07/26/1976  DATE OF SERVICE:                                 CLINIC NOTE  This a followup visit on this 35 year old Caucasian female gravida 4, para 4-0-0-4 with 4 vaginal deliveries who had an IUD insertion in 2010. She has been having hot flashes from that.  She has also dyspareunia. 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  She has a history of interstitial cystitis and had several laparoscopic surgeries with Dr. Mia Creek for ovarian cyst.  She I think probably would do well with a hysterectomy to solve her problems, as her hot flashes are probably secondary to the Mirena IUD and she definitely has mechanical problem with a prolapse.  I am going to send her back to her urologist to reevaluate her.  However, she has never been on [place]. She still has problems with interstitial cystitis, and I think that probably should be evaluated prior to her having surgery, I am going to have them bring her back to see Dr. Marice Potter to talk to her and see what she thinks about the possibility of a laparoscopic-assisted robotics in order to solve this young lady's problem problems.  The impression is interstitial cystitis, uterine prolapse with uterine retroversion, Mirena intrauterine device.          ______________________________ Argentina Donovan, MD    PR/MEDQ  D:  11/24/2010  T:  11/25/2010  Job:  [id number]

## 2010-12-12 NOTE — Assessment & Plan Note (Unsigned)
NAME:  CRISTIANNA, CYR NO.:  [id number]  MEDICAL RECORD NO.:  [id number]           PATIENT TYPE:  LOCATION:  [facility]           FACILITY:  PHYSICIAN:  Argentina Donovan, MD             DATE OF BIRTH:  DATE OF SERVICE:  11/06/2010                                 CLINIC NOTE  HISTORY OF PRESENT ILLNESS:  The patient has a 35 year old Caucasian female gravida 4, para 4-0-0-4 with 4 vaginal deliveries who has had an IUD insertion in 2010.  She has complained of hot flashes because her mother and grandmother went through menopause early.  She is complaining of pain, dyspareunia, and because she is a smoker and over 34, she cannot take oral contraceptives.  PHYSICAL EXAMINATION:  NECK:  On examination, thyroid symmetrical.  No dominant masses. BREASTS:  No dominant masses.  No nipple discharge.  No supraclavicular or axillary nodes. ABDOMEN:  Soft, flat, and nontender.  No masses.  No organomegaly. EXTERNAL GENITALIA:  Normal.  BUS within normal limits.  Vagina is clean, well rugated with a heavy leukorrhea.  The cervix shows some of Nabothian cyst.  It is parous.  I do not see the string on the IUD, although she said she feels it.  Bimanual the uterus in first-degree uterine prolapse and acutely retroverted with the fundus and the cul-de- sac.  She has had ovarian cysts removed before and see said she is having similar pain, although the ovaries could not be well outlined because of the uterus position. RECTAL:  No masses.  I have explained to the lady that hot flashes were probably due to the Mirena IUD and there would be no way of getting rid of those without removing it, and she has no alternative now since her paramour whom she has been with many years wants to get a vasectomy, but he has no insurance and has been trying to get insurance to get that done.  So for the present time, she wants the IUD to stay.  We have talked to her about position during  sex, and she is doing that actually, so those things she is having done.  I am going to get an ultrasound to rule out any cysts on the ovary that may be adding to her problems, but I have told her probably the only way to really solve this problem is to have a hysterectomy which she said if she waits too long will be worse, and I told her that will not be any more difficult to do, but that is one of the things she has to think about.  The patient's hot flashes secondary to the Mirena.  The dyspareunia secondary to the prolapse with a retroverted uterus.  __________ explanation for her problem and we are going to get an ultrasound to rule out any alternative.  I cannot see the string in the IUD.  She said she feels the string.  I cannot feel the string, but we will find out that if it is present with the ultrasound also.  IMPRESSION:  Dyspareunia and menopausal symptoms.          ______________________________ Argentina Donovan, MD  PR/MEDQ  D:  11/06/2010  T:  11/07/2010  Job:  [id number]

## 2010-12-26 LAB — CBC
HCT: 39.7 % (ref 36.0–46.0)
MCHC: 33.6 g/dL (ref 30.0–36.0)
Platelets: 162 10*3/uL (ref 150–400)
RDW: 12.5 % (ref 11.5–15.5)

## 2011-01-30 ENCOUNTER — Inpatient Hospital Stay (HOSPITAL_COMMUNITY): Payer: PRIVATE HEALTH INSURANCE

## 2011-01-30 ENCOUNTER — Inpatient Hospital Stay (HOSPITAL_COMMUNITY)
Admission: AD | Admit: 2011-01-30 | Discharge: 2011-01-30 | Disposition: A | Discharge status: Home / Self Care | Payer: PRIVATE HEALTH INSURANCE | Source: Ambulatory Visit | Attending: Obstetrics & Gynecology | Admitting: Obstetrics & Gynecology

## 2011-01-30 DIAGNOSIS — N83209 Unspecified ovarian cyst, unspecified side: Secondary | ICD-10-CM

## 2011-01-30 DIAGNOSIS — R109 Unspecified abdominal pain: Secondary | ICD-10-CM

## 2011-01-30 LAB — CBC
HCT: 43.6 % (ref 36.0–46.0)
Hemoglobin: 14.6 g/dL (ref 12.0–15.0)
MCV: 96 fL (ref 78.0–100.0)
RBC: 4.54 MIL/uL (ref 3.87–5.11)
WBC: 4.9 10*3/uL (ref 4.0–10.5)

## 2011-02-03 NOTE — Assessment & Plan Note (Signed)
NAME:  [name], [name] NO.:  [id number]   MEDICAL RECORD NO.:  [id number]          PATIENT TYPE:  POB   LOCATION:  [facility]         FACILITY:  [facility]   PHYSICIAN:  Tinnie Gens, MD        DATE OF BIRTH:  July 20, 1976   DATE OF SERVICE:  07/16/2009                                  CLINIC NOTE   The patient comes to the office today for a postpartum visit.  The  patient delivered a term female, vaginal delivery on August 05, 2009.  She did not have any tears.  She did not have any complications with her  delivery.  The baby is healthy and well, although she declares that he  does not sleep very much.  She did have some ongoing problems with her  heart following this delivery.  This is similar to what she had at her  last delivery.  She has seen Dr. Riley Kill at [facility].  She did  have a stress test that was normal.  She did have an ultrasound done of  her heart and he had told her that she would receive the results of this  by the end of the week.  Basically, the problem is the heart rate goes  into the 30s.  Following her delivery, she has had some vaginal  discharge that she feels is normal.  She has not resumed intercourse.  She would like to have an IUD.  She has had an Mirena IUD in the past  when she had her IUD removed, there was some endometrial tissue growing  around the IUD and had to be removed via surgery.  The patient is not  having any difficulties with her breast-feeding.  She is asking for a  note today to go back to work as her boyfriend lost his job.   PHYSICAL EXAMINATION:  GENERAL:  Well-developed, well-nourished 35-year-  old Caucasian, in no acute distress.  GENITALIA:  Externally, there are no lesions or discharges.  There is no  discharge.  Cervix is closed.  BIMANUAL:  No cervical motion tenderness.  Uterus has returned to  normal.  No ovary mass.   ASSESSMENT:  Postpartum exam.   PLAN:  I did discuss with Dr. Shawnie Pons if it  would be reasonable to replace  her Mirena IUD and she felt that would be okay.  The patient will  continue to follow up with Dr. Riley Kill for her Cardiology problems.  The  patient will return to the clinic for her Mirena as soon as possible and  or else to call the office as needed.      Remonia Richter, NP    ______________________________  Tinnie Gens, MD    LR/MEDQ  D:  07/16/2009  T:  07/17/2009  Job:  [id number]

## 2011-02-03 NOTE — Assessment & Plan Note (Signed)
NAME:  [name], [name] NO.:  [id number]   MEDICAL RECORD NO.:  [id number]          PATIENT TYPE:  POB   LOCATION:  [facility]         FACILITY[facility]  [facility] [place]   PHYSICIAN:  Elsie Lincoln, MD      DATE OF BIRTH:  [birth year]   DATE OF SERVICE:  07/25/2009                                  CLINIC NOTE   The patient is a 35 year old para 4 female who presents for IUD  insertion.  She already had a postpartum exam done by Dr. Shawnie Pons on  July 16, 2009.  Please see dictation for that note.  She recently had  an echocardiogram and stress test for her bradycardia and irregular  heart rate, which was normal, and she is cleared by Dr. Riley Kill, her  cardiologist.  The patient wants the IUD inserted.  She has chosen the  [place] IUD.  She has had one in the past.  The IUD had been surgically  removed due to the Mirena being growing into the endometrial tissue.  The patient understands this could happen again.  She also understands  the risk of infection and the perforation with insertion.  She does not  wish for sterilization even though she has done with childbearing.  She  is a smoker and does not want to take birth control pills.  She also  refuses all other forms of birth control.  The patient was consented and  consent is from the chart.  The procedure was then performed.  The EPT  is negative.  The patient was placed in dorsal lithotomy position.  The  cervix was brought into view.  There was slight descend of the uterus.  The cervix was repaired with Betadine and the anterior lip of the cervix  was grasped with single-tooth tenaculum.  The uterus sounded to  approximately 6 cm.  The Mirena was placed and the strings were cut to  approximately 3 cm.  There was good hemostasis at the end of the  procedure.  The patient is to come back in 6 weeks for string check.           ______________________________  Elsie Lincoln, MD     KL/MEDQ  D:  07/25/2009  T:  07/26/2009   Job:  [id number]

## 2011-02-03 NOTE — Assessment & Plan Note (Signed)
NAME:  [name], [name] NO.:  [id number]   MEDICAL RECORD NO.:  [id number]          PATIENT TYPE:  POB   LOCATION:  [facility]         FACILITY:  [facility]   PHYSICIAN:  Tinnie Gens, MD        DATE OF BIRTH:  [birth year]   DATE OF SERVICE:  11/15/2008                                  CLINIC NOTE   A real time transvaginal ultrasonography was done on this patient to  assess gestational age and heartbeat.  Transvaginal sonography reveals a  single intrauterine pregnancy to allow fetus crown-rump length of 32.9  mm and an estimated gestational age of 10.1, 2 weeks 1 day.  This gives  an [facility] of June 12, 2009, which agrees with her LMP which has an [facility]  of June 15, 2009.  The patient's uterus in total measures 9.1 cm.  Cervix is 4.4 cm.  There is no free pelvic fluid.  Regular fetal heart  rate activity is noted as well as fetal movement.  The patient's right  ovary measures 3.6 x 3.6 x 2.8 with a corpus luteum cyst associated with  this.  The patient's left ovary measures 2.8 x 2.6 x 1.6 and is normal.           ______________________________  Tinnie Gens, MD     TP/MEDQ  D:  11/15/2008  T:  11/16/2008  Job:  [id number]

## 2011-02-03 NOTE — Discharge Summary (Signed)
[name], [name]             ACCOUNT NO.:  [id number]   MEDICAL RECORD NO.:  [id number]          PATIENT TYPE:  OBV   LOCATION:  6533                         FACILITY:  [facility]   PHYSICIAN:  Arturo Morton. Riley Kill, MD, FACCDATE OF BIRTH:  [birth year]   DATE OF ADMISSION:  10/17/2007  DATE OF DISCHARGE:  10/19/2007                               DISCHARGE SUMMARY   PRIMARY FINAL DISCHARGE DIAGNOSES:  Chest pain, cardiac enzymes negative  for myocardial infarction and ejection fraction normal, no further  workup at this time.   SECONDARY DIAGNOSES:  1. Bradycardia, no heart block, and no evidence of chronotropic      incompetence.  Followup with CardioNet monitor as outpatient.  2. Status post delivery approximately 9 days ago of a healthy baby.  3. Mild hypertension.  4. Status post appendectomy and laparoscopy.  5. History of interstitial cystitis.  6. Tobacco use.   TIME AT DISCHARGE:  Thirty seven minutes.   HOSPITAL COURSE:  [name] is a 35 year old female with no previous  history of coronary artery disease.  When she went in for a 1-week  checkup after her baby, she told the nurse midwife she was having some  chest pain.  She was also having right greater than left lower extremity  edema.  She was sent over to the cardiology office where an initial echo  showed normal LV size and normal RV size, but because of the chest pain,  she was admitted for further evaluation.   A D-dimer was drawn that was elevated; however, the OB/GYN said that was  not a good screening test in someone with a recent delivery.  Therefore,  a CT of the chest was performed which showed no PE.  Additionally, she  had lower extremity Dopplers which showed no DVT.  Serial CK-MB and  troponin Is were negative for MI.  A full echocardiogram was performed,  which showed an EF of 55% to 60%, no pericardial effusion and a VAS of  22.   [name] was noted to have sinus bradycardia on telemetry.  Her  heart rate would drop into the 40s on a frequent basis, especially when  she was asleep.  When she was up and moving around, her heart rate would  increase appropriately, so she was not felt to have chronotropic  incompetence.  Dr. Riley Kill evaluated [name] and felt that, since  she was not having any syncope or pre-syncope, she could be safely  evaluated as an outpatient with a CardioNet monitor.  He also advised  her not to drive for 2 weeks.  On October 19, 2007, Dr. Riley Kill felt  that [name] could be safely evaluated as an outpatient and was  discharged home.   DISCHARGE INSTRUCTIONS:  Her activity level to be increased gradually  but no driving.  She is to follow up with Dr. Riley Kill on February 13 at  11:00 a.m.  She is to follow up Dr. Dossie Arbour and Dr. Shawnie Pons as well.  She  is to drink lots of water.   DISCHARGE MEDICATIONS:  She is to continue the Colace, vitamins,  iron,  and ibuprofen p.r.n. that she was taking at home.      Theodore Demark, PA-C      Arturo Morton. Riley Kill, MD, [facility]  Electronically Signed    RB/MEDQ  D:  10/19/2007  T:  10/19/2007  Job:  [id number]   cc:   Shelbie Proctor. Shawnie Pons, M.D.  Dr. Dossie Arbour

## 2011-02-03 NOTE — H&P (Signed)
[name], [name]             ACCOUNT NO.:  [id number]   MEDICAL RECORD NO.:  [id number]          PATIENT TYPE:  INP   LOCATION:  6533                         FACILITY:  [facility]   PHYSICIAN:  Arturo Morton. Riley Kill, MD, FACCDATE OF BIRTH:  19-Oct-1975   DATE OF ADMISSION:  10/17/2007  DATE OF DISCHARGE:  10/03/2007                              HISTORY & PHYSICAL   CHIEF COMPLAINT:  Epigastric discomfort.   HISTORY OF PRESENT ILLNESS:  The patient is a 35 year old white female  who 1 week ago delivered a healthy child.  Today she presented to the  [facility] emergency room where she was seen by a nurse midwife.  Evaluation there was remarkable for the fact that the patient had felt  some discomfort in the mid-epigastric area.  She thought it was most  likely some soreness.  She also had some sharp shooting pains in the  back.  She has had a recent epidural.  She had some tingling and  numbness in both the arms and the legs.  She now feels somewhat better.  She has not had any further problems today.  She has also noted some  swelling predominantly in the right leg worse than the left.  After  discussion with them today, we went ahead and she got a CT scan done  over at [facility].  This was done to exclude things such as  pulmonary embolus.  It was read as a suboptimal study but with no  evidence of PE, pneumothorax, or pneumonia.  There is some mild  atelectasis in the left base.  Other evaluation over there included some  laboratory studies.  These included a total protein of 5.8, albumin of  3.0, and SGPT of 59.  Alkaline phosphatase was normal.  Cardiac enzymes  revealed a CK of 38, CK-MB 1.6, and troponin of 0.04.  The most  remarkable finding was an EKG which revealed some fairly significant  sinus bradycardia.  This EKG also demonstrated an incomplete right  bundle branch block.  The EKG done at [facility] actually  demonstrated a rightward oriented axis, but the  axis here in our office  is completely normal.  She is obviously quite anxious, and they live  nearly 30-40 minutes from any type of health care facility.   Of note, the patient has previously had two prior deliveries without  complication.  In this delivery, she was taken out of work in December  because of mild hypertension.  We also performed a brief echocardiogram  in the office this evening at 6:30 p.m.  No technicians were available,  but the nurses and I were able to do it which revealed normal RV size,  normal LV.  There was some mitral regurgitation by color Doppler.  There  was no evidence of significant pericardial effusion, and overall left  ventricular function was normal.   CURRENT MEDICATIONS:  Colace, prenatal vitamins, iron, and the patient  takes ibuprofen on a p.r.n. basis.   ALLERGIES:  PENICILLIN AND SULFA.   PAST MEDICAL HISTORY:  1. Appendectomy.  2. Laparoscopy.  3. Interstitial cystitis.  FAMILY HISTORY:  Mother is alive and well.  Father is alive, but she  does not know much about his family history.  There is no obvious cancer  in the family.   SOCIAL HISTORY:  She is gravida 3, para 3, AB 0.  She has been a smoker  for 8 years and started smoking related to the tension of her first  marriage apparently and two children.  She has been a Copywriter, advertising.   REVIEW OF SYSTEMS:  The episodes of pain today lasted about 10-15  minutes.  Other than the radiation, as noted, there was no significant  radiation.  She had some swelling in the right leg greater than the left  leg.   PHYSICAL EXAMINATION:  GENERAL:  She is alert and oriented and in no  acute distress.  Blood pressure is equal in the right and left arm at  132/70.  There is not a significant difference.  NECK:  There is no jugular venous distention.  Carotid upstrokes are  brisk without bruits.  LUNGS:  Clear to auscultation and percussion.  The PMI is nondisplaced.  CARDIOVASCULAR:  The  heart sounds are somewhat quiet and a little bit  distant.  There is a fairly prominent S4 gallop, but I cannot appreciate  anything more than about 1/6 systolic ejection murmur.  No diastolic  murmurs are appreciated.  ABDOMEN:  The abdomen overall is soft, but there is some tenderness in  the mid-epigastric area.  EXTREMITIES:  Trace edema bilaterally, but on the examination today, the  right and the left are unremarkable.   Electrocardiogram done at [facility] revealed a marked right axis  deviation.  The EKG done here reveals a normal axis.  There is an  incomplete right bundle and an otherwise normal tracing.  There is sinus  bradycardia.   The CT is as noted in the chart.  There is no evidence of PE in a  suboptimal study.  No PTX or PNA, mild left base atelectasis and trace  effusion.   LABORATORY DATA:  Laboratory studies are as noted.   IMPRESSION:  1. Recent delivery with some mild back discomfort radiating into both      arms and legs and status post epidural.  2. Mildly abnormal electrocardiogram on admission but now is normal in      the cardiology office, except for minor incomplete right bundle.  3. Normal pulse oximetry.  4. No definite findings by echocardiography, although this is a      cursory study.  5. Recent delivery.  6. Some lower extremity edema.   PLAN:  The patient is quite anxious, and her husband was concerned.  They do live quite a distance away, so she will be admitted for  overnight observation.  I suspect it will take time for all of this to  resolve.      Arturo Morton. Riley Kill, MD, [facility]  Electronically Signed     TDS/MEDQ  D:  10/17/2007  T:  10/18/2007  Job:  [id number]

## 2011-02-03 NOTE — Assessment & Plan Note (Signed)
[facility]                            CARDIOLOGY OFFICE NOTE   [name], [name]                      MRN:          [id number]  DATE:05/24/2008                            DOB:          May 01, 1976    HISTORY OF PRESENT ILLNESS:  [name] is in for a follow-up visit.  In general, she is stable.  She is now off all of her medications and  currently is taking none.  Her child has been healthy and she has done  well.  Unfortunately, [name] has increased her smoking now at about  6 cigarettes a day.  Importantly, the patient has had, which she thought  were a couple of panic attacks, but in general has been stable.  Her  event monitor was essentially unremarkable.   PHYSICAL EXAMINATION:  Today on examination, she appears healthy.  Her  weight is 143 pounds.  Her blood pressure 114/64 and pulse 85.  The lung  fields are clear.  The cardiac exam reveals a minimal systolic ejection  murmur that would be graded as 1/6.   There is no extremity edema noted.   Her electrocardiogram is entirely within normal limits and the QTc  interval is completely normal.   This young lady is doing well from a clinical standpoint.  Her  echocardiogram in January suggested very mild mitral valve thickening  with mild mitral regurgitation with normal chamber sizes, none of these  appeared to be clinically significant.  She appears well now, and there  is virtually no evidence of any type of underlying progressive process.  We will see her back in followup in about 1 years time.  Should there be  any change in the interim, she is to give [place] a call.  I have encouraged  her not to smoke.     Arturo Morton. Riley Kill, MD, [facility]  Electronically Signed    TDS/MedQ  DD: 05/25/2008  DT: 05/25/2008  Job #: [id number]   cc:   Allie Bossier, MD

## 2011-02-06 NOTE — Group Therapy Note (Signed)
NAME:  [name], [name] NO.:  [id number]   MEDICAL RECORD NO.:  [id number]          PATIENT TYPE:  POB   LOCATION:  [facility]                   FACILITY:  [facility]   PHYSICIAN:  Tinnie Gens, MD        DATE OF BIRTH:  1976-07-22   DATE OF SERVICE:  03/30/2006                                    CLINIC NOTE   Patient seen at [facility].   CHIEF COMPLAINT:  Lower abdominal pain and spotting with cramping.   HISTORY OF PRESENT ILLNESS:  The patient is a 35 year old gravida 3, para 2-  0-1-2, who has diagnosis of stage I endometriosis and interstitial cystitis.  She previously had been on __________ IUD and she takes Vicodin as needed  for pain.  She reports her pain is intermittent, is crampy in nature.  It is  associated with some urinary incontinence and difficulty with initiating  flow of her urine.  The patient has not had a Pap for some time.  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  The  patient is unsure if her pain is related to endometriosis or interstitial  cystitis.   EXAM:  Her vitals are in the chart.  She is well-developed, well-nourished  white female in no acute distress.  ABDOMEN:  Soft, nontender, nondistended.  GU:  Normal external female genitalia.  BUS is normal.  Vagina is pink and  rugated.  Cervix is anterior without lesion.  The uterus is retroverted and  tender on palpation.  The adnexa are without mass or tenderness.   IMPRESSION:  1.  Pelvic pain.  2.  Endometriosis.  3.  History of interstitial cystitis.  4.  Intrauterine device user.   PLAN:  1.  Continue IUD, should be changed February of next year or several months      after that to decrease the risk of pregnancy.  2.  Will give trial of Naprosyn 500 mg p.o. b.i.d. for mild pain, still use      Vicodin 5/500 q.4-6h. p.r.n. for extreme pain and trial of Ditropan 5 mg      one p.o. b.i.d. and see if that helps her pain.  3.  The patient will  follow up in 2 months, will see how this is working.      To continue continuous NuvaRing since the patient is intolerant of pills      secondary to systemic side effects, to control endometriosis until she      desires pregnancy.           ______________________________  Tinnie Gens, MD    TP/MEDQ  D:  03/30/2006  T:  03/30/2006  Job:  [id number]

## 2011-02-06 NOTE — Assessment & Plan Note (Signed)
NAME:  [name], HARBOR NO.:  [id number]   MEDICAL RECORD NO.:  [id number]          PATIENT TYPE:  POB   LOCATION:  [facility]         [facility]ACILITY[facility]  [facility]   PHYSICIAN:  Tinnie Gens, MD        DATE OF BIRTH:  09-13-1976   DATE OF SERVICE:                                  CLINIC NOTE   CHIEF COMPLAINT:  IUD removal.   HISTORY OF PRESENT ILLNESS:  The patient is a 35 year old gravida 3,  para 2, with a diagnosis of stage I endometriosis and interstitial  cystitis, who has been on a Mirena IUD for 5 years.  It is time for this  to be removed.  She is interested in getting pregnant.   PHYSICAL EXAMINATION:  Shows a well-developed, well-nourished female in  no acute distress.  GENITOURINARY:  Normal external female genitalia.  BUS is normal.  The  vagina is pink and rugated.  The cervix is anterior without lesions.  The IUD strings could not be visualized.  Attempt was made under direct  visualization, with ultrasound, to remove the IUD with hemostats and  other instruments.  However, despite a lot of patient compliance and a  lot of discomfort to her, her IUD could not be removed in the office.   IMPRESSION:  Stuck intrauterine device.   PLAN:  We will schedule her in the OR under MAC anesthesia for IUD  removal plus cervical block at that time.           ______________________________  Tinnie Gens, MD     TP/MEDQ  D:  11/16/2006  T:  11/16/2006  Job:  [id number]

## 2011-02-06 NOTE — Assessment & Plan Note (Signed)
NAME:  [name], BACHICHA NO.:  [id number]   MEDICAL RECORD NO.:  [id number]          PATIENT TYPE:  POB   LOCATION:  [facility]         FACILITY:  [facility]   PHYSICIAN:  Tinnie Gens, MD        DATE OF BIRTH:  Feb 22, 1976   DATE OF SERVICE:  12/14/2006                                  CLINIC NOTE   CHIEF COMPLAINT:  Yearly examination and postoperative check.   HISTORY OF PRESENT ILLNESS:  The patient is a 35 year old gravida 3,  para 2, who has __________ interstitial cystitis who is status post  removal of IUD approximately 2 weeks ago. The patient since then has had  unprotected intercourse and wishing to get pregnant. She desires to be  put on prenatal vitamins. It has been more than a year since her last  pap smear. She is without other complaints today.   PAST MEDICAL HISTORY:  1. Endometriosis.  2. Cold intolerance.   SOCIAL HISTORY:  She has had several bladder surgeries and laparoscopies  as well to diagnose her endometriosis as well as treatment for  interstitial cystitis.   MEDICATIONS:  1. She is on over-the-counter sinus medication.  2. Multivitamin daily.   ALLERGIES:  SULFA AND PENICILLIN.   FAMILY HISTORY:  Essentially negative.   SOCIAL HISTORY:  A third of a pack of cigarettes per day. She works for  [employer].   REVIEW OF SYSTEMS:  A 12-point system review is significant for  headache, vision changes, hearing problems, difficulty swallowing,  nausea, vomiting, diarrhea, constipation, chest pain, shortness of  breath, lower extremity swelling, abnormal vaginal bleeding. The patient  has severe cold intolerance.   PHYSICAL EXAMINATION:  GENERAL:  The patient is a well-developed and  well-nourished female in no acute distress.  ABDOMEN: Soft and nontender.  GU: Normal external female genitalia. BUS __________  rugated. Cervix is  parous without lesions. Uterus is small and anteverted or retroverted.  No adnexal masses or  tenderness.   IMPRESSION:  Gynecologic examination with pap smear and postoperative  check. Doing well.   PLAN:  1. Pap smear today.  2. Start THA one p.o. daily for 2-3 months prior to achieving      pregnancy.  3. Check TSH.           ______________________________  Tinnie Gens, MD     TP/MEDQ  D:  12/14/2006  T:  12/14/2006  Job:  [id number]

## 2011-02-06 NOTE — Op Note (Signed)
[name], [name]             ACCOUNT NO.:  [id number]   MEDICAL RECORD NO.:  [id number]          PATIENT TYPE:  AMB   LOCATION:  [facility]                           [facility]   PHYSICIAN:  Tanya S. Shawnie Pons, M.D.   DATE OF BIRTH:  Jul 23, 1976   DATE OF PROCEDURE:  11/26/2006  DATE OF DISCHARGE:                               OPERATIVE REPORT   PREOPERATIVE DIAGNOSIS:  Retained intrauterine device.   POSTOPERATIVE DIAGNOSIS:  Retained intrauterine device.   PROCEDURE:  Removal of IUD.   SURGEON:  Shelbie Proctor. Shawnie Pons, M.D.   ASSISTANT:  None.   ANESTHESIA:  MAC and local, Donald T. Pamalee Leyden, M.D.   SPECIMENS:  None.   ESTIMATED BLOOD LOSS:  Minimal.   COMPLICATIONS:  None.   INDICATIONS FOR PROCEDURE:  Briefly, patient is a 35 year old who has  had her IUD in for approximately 5 years who needed it removed.  She had  failed multiple attempts in the office secondary to discomfort of the  patient.   PROCEDURE:  Patient was taken to the OR.  She was placed in dorsal  lithotomy position and Allen stirrups.  MAC was used.  A speculum was  inserted in the vagina.  The cervix visualized.  We used 10 mL of 1%  lidocaine for paracervical block.  An IUD remover was then used and  passed multiple times until finally the strings of the IUD were  retrieved and the IUD was removed.  The IUD did appear to be embedded,  especially at the lower portion with the strings in the lower part of  the IUD which probably explains the difficulty in removing it in the  office.  The patient tolerated the procedure well.  The patient was  awakened and taken to the recovery room in stable condition.  All  instrument and lap counts were correct x2.           ______________________________  Shelbie Proctor. Shawnie Pons, M.D.     TSP/MEDQ  D:  11/26/2006  T:  11/26/2006  Job:  [id number]

## 2011-03-19 ENCOUNTER — Encounter: Payer: Self-pay | Admitting: Cardiology

## 2011-05-19 ENCOUNTER — Ambulatory Visit (INDEPENDENT_AMBULATORY_CARE_PROVIDER_SITE_OTHER): Payer: PRIVATE HEALTH INSURANCE | Admitting: Cardiology

## 2011-05-19 ENCOUNTER — Telehealth: Payer: Self-pay | Admitting: Cardiology

## 2011-05-19 ENCOUNTER — Encounter: Payer: Self-pay | Admitting: Cardiology

## 2011-05-19 VITALS — BP 108/68 | HR 123 | Ht 64.0 in | Wt 151.0 lb

## 2011-05-19 DIAGNOSIS — R000 Tachycardia, unspecified: Secondary | ICD-10-CM

## 2011-05-19 LAB — CBC WITH DIFFERENTIAL/PLATELET
Basophils Absolute: 0.1 10*3/uL (ref 0.0–0.1)
Eosinophils Absolute: 0 10*3/uL (ref 0.0–0.7)
HCT: 41.5 % (ref 36.0–46.0)
Hemoglobin: 14.1 g/dL (ref 12.0–15.0)
Lymphs Abs: 1 10*3/uL (ref 0.7–4.0)
MCHC: 34 g/dL (ref 30.0–36.0)
MCV: 97.4 fl (ref 78.0–100.0)
Neutro Abs: 3.2 10*3/uL (ref 1.4–7.7)
RDW: 12 % (ref 11.5–14.6)

## 2011-05-19 LAB — BASIC METABOLIC PANEL
BUN: 10 mg/dL (ref 6–23)
CO2: 26 mEq/L (ref 19–32)
Calcium: 9.3 mg/dL (ref 8.4–10.5)
GFR: 84.86 mL/min (ref >60.00)
Glucose, Bld: 97 mg/dL (ref 70–99)

## 2011-05-19 NOTE — Telephone Encounter (Signed)
Pt calling wanting to see stuckey Friday, told her he will not be here, she wants a nurse call re irregular heartbeat, off and on for 3 days, denies chest pain, sob, dizziness, some lightheadedness

## 2011-05-19 NOTE — Patient Instructions (Signed)
Your physician recommends that you schedule a follow-up appointment in: one week   Your physician recommends that you return for lab work in: today (CBC, TSH, FreeT4)  Your physician has recommended that you wear a holter monitor. Holter monitors are medical devices that record the heart's electrical activity. Doctors most often use these monitors to diagnose arrhythmias. Arrhythmias are problems with the speed or rhythm of the heartbeat. The monitor is a small, portable device. You can wear one while you do your normal daily activities. This is usually used to diagnose what is causing palpitations/syncope (passing out).  Your physician has requested that you have an echocardiogram. Echocardiography is a painless test that uses sound waves to create images of your heart. It provides your doctor with information about the size and shape of your heart and how well your heart's chambers and valves are working. This procedure takes approximately one hour. There are no restrictions for this procedure.   Dr Riley Kill recommends that you decrease your caffeine consumption by half

## 2011-05-19 NOTE — Telephone Encounter (Signed)
Spoke with pt and appt made for today with Dr. Riley Kill at 2:00pm. Mylo Red RN

## 2011-05-20 ENCOUNTER — Encounter (INDEPENDENT_AMBULATORY_CARE_PROVIDER_SITE_OTHER): Payer: PRIVATE HEALTH INSURANCE | Admitting: *Deleted

## 2011-05-20 DIAGNOSIS — R002 Palpitations: Secondary | ICD-10-CM

## 2011-05-20 DIAGNOSIS — R000 Tachycardia, unspecified: Secondary | ICD-10-CM

## 2011-05-21 NOTE — Progress Notes (Signed)
HPI: She has noted her heart rate is quite fast.  This started a few days ago.   For the last three days she felt like she was having a panic attack.  She stopped bee pollen capsules.  She is not short of breath.  She has an IUD, and no periods.  She stopped her multivitamin.  She still does smoke about a half a pack of cigarettes per day.  She is also using Diet Sunkist, perhaps 2 20 ounce bottles per days.  She is not really using pseudofed per se.  Of note, after the past episode, she has not felt a fast heart rate.     Current Outpatient Prescriptions  Medication Sig Dispense Refill  . acetaminophen (TYLENOL) 325 MG tablet Take 325 mg by mouth as needed.        Marland Kitchen guaiFENesin (MUCINEX) 600 MG 12 hr tablet Take 600 mg by mouth as needed.        Marland Kitchen ibuprofen (ADVIL,MOTRIN) 200 MG tablet Take 200 mg by mouth as needed.        Marland Kitchen oxymetazoline (AFRIN) 0.05 % nasal spray Place 2 sprays into the nose as needed.        . pseudoephedrine (SUDAFED) 30 MG tablet Take 30 mg by mouth as needed.          Allergies  Allergen Reactions  . Penicillins   . Sulfonamide Derivatives     Past Medical History  Diagnosis Date  . History of tobacco abuse   . Pyelonephritis   . Ovarian cyst   . Interstitial cystitis   . Panic disorder   . Endometriosis   . Arrhythmia     Bradycardia  . Sinus problem     Right maxillary (frequent)    Past Surgical History  Procedure Date  . Appendectomy   . Laparoscopy     Family History  Problem Relation Age of Onset  . Cancer Neg Hx     No obvious cancer    History   Social History  . Marital Status: Single    Spouse Name: N/A    Number of Children: 2  . Years of Education: N/A   Occupational History  . Dental technician    Social History Main Topics  . Smoking status: Current Everyday Smoker -- 11 years  . Smokeless tobacco: Not on file  . Alcohol Use: No  . Drug Use: No  . Sexually Active:    Other Topics Concern  . Not on file   Social  History Narrative   Started smoking related to the tension of her first marriage and 2 children.    ROS: Please see the HPI.  All other systems reviewed and negative.  PHYSICAL EXAM:  BP 108/68  Pulse 123  Ht 5\' 4"  (1.626 m)  Wt 151 lb (68.493 kg)  BMI 25.92 kg/m2  SpO2 98%  General: Well developed, well nourished, in no acute distress. Head:  Normocephalic and atraumatic. Neck: no JVD Lungs: Clear to auscultation and percussion. Heart: Normal S1 and S2.  No murmur, rubs or gallops.  Abdomen:  Normal bowel sounds; soft; non tender; no organomegaly Pulses: Pulses normal in all 4 extremities. Extremities: No clubbing or cyanosis. No edema. Neurologic: Alert and oriented x 3.  EKG:  ST.  Incomplete RBBB.   Borderline ECG.   ASSESSMENT AND PLAN:

## 2011-05-24 DIAGNOSIS — R000 Tachycardia, unspecified: Secondary | ICD-10-CM | POA: Insufficient documentation

## 2011-05-24 NOTE — Assessment & Plan Note (Addendum)
The cause of her fast rate is unclear.  I think she needs a monitor to see what she has at night.  We will also check thyroids, she will cut down her Sunkist use, and we will see her back in follow up in fairly short order.  There is little to suggest PE, or other findings. Previously she had some bradycardia, and also some chest pain, and workup was negative.  I will follow her closely.

## 2011-05-29 ENCOUNTER — Encounter: Payer: Self-pay | Admitting: Cardiology

## 2011-05-29 ENCOUNTER — Ambulatory Visit (HOSPITAL_COMMUNITY): Payer: PRIVATE HEALTH INSURANCE | Attending: Cardiology | Admitting: Radiology

## 2011-05-29 ENCOUNTER — Ambulatory Visit (INDEPENDENT_AMBULATORY_CARE_PROVIDER_SITE_OTHER): Payer: PRIVATE HEALTH INSURANCE | Admitting: Cardiology

## 2011-05-29 VITALS — BP 106/70 | HR 87 | Ht 64.0 in | Wt 150.0 lb

## 2011-05-29 DIAGNOSIS — R002 Palpitations: Secondary | ICD-10-CM

## 2011-05-29 DIAGNOSIS — R000 Tachycardia, unspecified: Secondary | ICD-10-CM | POA: Insufficient documentation

## 2011-05-29 DIAGNOSIS — Z8659 Personal history of other mental and behavioral disorders: Secondary | ICD-10-CM

## 2011-05-29 DIAGNOSIS — F17200 Nicotine dependence, unspecified, uncomplicated: Secondary | ICD-10-CM

## 2011-05-29 NOTE — Patient Instructions (Signed)
Your physician recommends that you schedule a follow-up appointment in: 3 months with Dr. Riley Kill.

## 2011-05-31 NOTE — Progress Notes (Signed)
HPI:  [name] comes in to review her data. Importantly, her thyroids were normal.  She and I reviewed her echo in detail.  Her monitor shows that she maintains a normal pulse at night when she is not stimulated.  She has backed down on her caffeine, and she is a bit better.  She also notes a lot of her stress, and thinks that she may have intermittent panic attacks.  By history, she may have neurally mediated syncope.  She has passed out many times in the past.  She has ovarian cysts that occsionally rupture--when she gets the pain, she will pass out.  She does get an aura.  She sees a urologist for interstitial cystitis.  She has a large bladder, and has also consider a hysterectomy in the past.  She was previously seen for bradycardia, and now tachycardia.  No symptoms to suggest PE.      Current Outpatient Prescriptions  Medication Sig Dispense Refill  . acetaminophen (TYLENOL) 325 MG tablet Take 325 mg by mouth as needed.        . Cholecalciferol (VITAMIN D3) 1000 UNITS CAPS Take 1,000 Units by mouth as needed.        Marland Kitchen guaiFENesin (MUCINEX) 600 MG 12 hr tablet Take 600 mg by mouth as needed.        . hydrocodone-acetaminophen (LORCET-HD) 5-500 MG per capsule Take 1 capsule by mouth every 6 (six) hours as needed.        Marland Kitchen ibuprofen (ADVIL,MOTRIN) 200 MG tablet Take 200 mg by mouth as needed.        . loratadine-pseudoephedrine (CLARITIN-D 12-HOUR) 5-120 MG per tablet Take 1 tablet by mouth as needed.        . Multiple Vitamins-Minerals (MULTIVITAMIN WITH MINERALS) tablet Take 1 tablet by mouth daily.        . Nutritional Supplements (NUTRITIONAL SUPPLEMENT PO) Take by mouth. Melaleuca Cell Wise Taking Daily       . Nutritional Supplements (NUTRITIONAL SUPPLEMENT PO) Take by mouth. Melaleuca Provex CV Taking daily       . OMEGA-3 KRILL OIL 300 MG CAPS Take 300 mg by mouth.        Marland Kitchen Specialty Vitamins Products (VARISAN VITALITY PO) Take by mouth daily.        Marland Kitchen tretinoin (RETIN-A) 0.025 % cream Apply  topically at bedtime.          Allergies  Allergen Reactions  . Other     Red food Coloring  . Penicillins   . Sulfonamide Derivatives     Past Medical History  Diagnosis Date  . History of tobacco abuse   . Pyelonephritis   . Ovarian cyst   . Interstitial cystitis   . Panic disorder   . Endometriosis   . Arrhythmia     Bradycardia  . Sinus problem     Right maxillary (frequent)    Past Surgical History  Procedure Date  . Appendectomy   . Laparoscopy     Family History  Problem Relation Age of Onset  . Cancer Neg Hx     No obvious cancer    History   Social History  . Marital Status: Single    Spouse Name: N/A    Number of Children: 2  . Years of Education: N/A   Occupational History  . Dental technician    Social History Main Topics  . Smoking status: Current Everyday Smoker -- 11 years  . Smokeless tobacco: Not on file  .  Alcohol Use: No  . Drug Use: No  . Sexually Active:    Other Topics Concern  . Not on file   Social History Narrative   Started smoking related to the tension of her first marriage and 2 children.    ROS: Please see the HPI.  All other systems reviewed and negative.  PHYSICAL EXAM:  BP 106/70  Pulse 87  Ht 5\' 4"  (1.626 m)  Wt 150 lb (68.04 kg)  BMI 25.75 kg/m2  General: Well developed, well nourished, in no acute distress. Head:  Normocephalic and atraumatic. Neck: no JVD Lungs: Clear to auscultation and percussion. Heart: Normal S1 and S2.  No murmur, rubs or gallops.  Abdomen:  Normal bowel sounds; soft; non tender; no organomegaly Pulses: Pulses normal in all 4 extremities. Extremities: No clubbing or cyanosis. No edema. Neurologic: Alert and oriented x 3.  EKG:  NSR.  Incomplete RBBB.  Cannot exclude LAE  (not seen on echo). ASSESSMENT AND PLAN:

## 2011-06-07 NOTE — Assessment & Plan Note (Signed)
Her holter was reviewed today.  She appropriately slows down at night when she is asleep.  I suspect the cause of tachycardia is multifactorial, but would not suggest any specific meds at the present time.

## 2011-06-07 NOTE — Assessment & Plan Note (Signed)
Some of the symptoms could be due to this.  She is under tremendous stress at the present time, and this could be the genesis of her problems.  We would suggest perhaps reassurance for now before considering a beta blocker.

## 2011-06-07 NOTE — Assessment & Plan Note (Signed)
I would encourage her, as I have, to quit, on several occasions.

## 2011-06-09 ENCOUNTER — Telehealth: Payer: Self-pay | Admitting: Cardiology

## 2011-06-09 DIAGNOSIS — R000 Tachycardia, unspecified: Secondary | ICD-10-CM

## 2011-06-09 MED ORDER — METOPROLOL TARTRATE 25 MG PO TABS: ORAL_TABLET | ORAL | Status: DC

## 2011-06-09 NOTE — Telephone Encounter (Signed)
Discussed with Dr Riley Kill and he recommends Metoprolol Tartrate 25mg  take one-half tablet by mouth daily as needed for palpitations. Pt aware of instructions and Rx sent to pharmacy.

## 2011-06-09 NOTE — Telephone Encounter (Signed)
I spoke with the pt and she feels like when her heart rate increases that she is having a panic attack.  Per the pt at her last office visit Dr Riley Kill discussed possibly using an as needed fast acting beta blocker for palpitations. The pt is interested in this medication.  The pt was also asking about anxiety medications.  I made the pt aware that Dr Riley Kill does not prescribe this type of medication and that this would need to come from PCP.  I will speak with Dr Riley Kill about beta blocker and call the pt back.  The pt uses [facility] on [address] in [place].

## 2011-06-09 NOTE — Telephone Encounter (Signed)
Pt calling wanting to talk to nurse about pt needing medication. Pt c/o panic attacks. Please return pt call to discuss further.

## 2011-06-11 LAB — CARDIAC PANEL(CRET KIN+CKTOT+MB+TROPI)
CK, MB: 1.6
CK, MB: 1.2
Relative Index: INVALID
Total CK: 38
Total CK: 29
Troponin I: 0.04

## 2011-06-11 LAB — CBC
Hemoglobin: 10.6 — ABNORMAL LOW
MCHC: 34.3
MCHC: 35.3
MCV: 94.9
MCV: 96.2
Platelets: 210
RBC: 3.38 — ABNORMAL LOW
RDW: 13.1
RDW: 13.3

## 2011-06-11 LAB — HEPATIC FUNCTION PANEL
Albumin: 3 — ABNORMAL LOW
Alkaline Phosphatase: 121 — ABNORMAL HIGH
Indirect Bilirubin: 0.4
Total Protein: 5.8 — ABNORMAL LOW

## 2011-06-11 LAB — AMYLASE: Amylase: 67

## 2011-06-11 LAB — BASIC METABOLIC PANEL
BUN: 8
CO2: 26
Calcium: 8.9
Creatinine, Ser: 0.7
GFR calc Af Amer: >60

## 2011-06-22 LAB — URINALYSIS, ROUTINE W REFLEX MICROSCOPIC
Hgb urine dipstick: NEGATIVE
Specific Gravity, Urine: <1.005 — ABNORMAL LOW
Urobilinogen, UA: 0.2

## 2011-06-22 LAB — POCT PREGNANCY, URINE: Preg Test, Ur: NEGATIVE

## 2011-08-17 ENCOUNTER — Telehealth: Payer: Self-pay | Admitting: *Deleted

## 2011-08-17 DIAGNOSIS — B9689 Other specified bacterial agents as the cause of diseases classified elsewhere: Secondary | ICD-10-CM

## 2011-08-17 DIAGNOSIS — B373 Candidiasis of vulva and vagina: Secondary | ICD-10-CM

## 2011-08-17 MED ORDER — METRONIDAZOLE 500 MG PO TABS: 500.0000 mg | ORAL_TABLET | Freq: Two times a day (BID) | ORAL | Status: AC

## 2011-08-17 MED ORDER — FLUCONAZOLE 150 MG PO TABS: 150.0000 mg | ORAL_TABLET | Freq: Once | ORAL | Status: AC

## 2011-08-17 NOTE — Telephone Encounter (Signed)
Patient is having symptoms again of both yeast and bacterial infection, she has had both of these in the past at the same time as well.

## 2011-09-11 ENCOUNTER — Ambulatory Visit: Payer: PRIVATE HEALTH INSURANCE | Admitting: Cardiology

## 2011-09-22 DIAGNOSIS — Z22322 Carrier or suspected carrier of Methicillin resistant Staphylococcus aureus: Secondary | ICD-10-CM

## 2011-09-22 HISTORY — DX: Carrier or suspected carrier of methicillin resistant Staphylococcus aureus: Z22.322

## 2011-10-23 ENCOUNTER — Encounter: Payer: Self-pay | Admitting: Cardiology

## 2011-10-23 ENCOUNTER — Ambulatory Visit (INDEPENDENT_AMBULATORY_CARE_PROVIDER_SITE_OTHER): Payer: PRIVATE HEALTH INSURANCE | Admitting: Cardiology

## 2011-10-23 VITALS — BP 110/72 | HR 81 | Ht 64.0 in | Wt 158.0 lb

## 2011-10-23 DIAGNOSIS — Z87891 Personal history of nicotine dependence: Secondary | ICD-10-CM

## 2011-10-23 DIAGNOSIS — R000 Tachycardia, unspecified: Secondary | ICD-10-CM

## 2011-10-23 DIAGNOSIS — R002 Palpitations: Secondary | ICD-10-CM

## 2011-10-23 NOTE — Patient Instructions (Signed)
Your physician wants you to follow-up in: 6 MONTHS.  If you are doing well then you can move this to 1 YEAR per Dr Riley Kill. You will receive a reminder letter in the mail two months in advance. If you don't receive a letter, please call our office to schedule the follow-up appointment.  Your physician recommends that you continue on your current medications as directed. Please refer to the Current Medication list given to you today.

## 2011-10-25 NOTE — Progress Notes (Signed)
   HPI:  Mina Marble is in for follow up.  In general, she is much better than she was.  She notices occasional skip beats, but that is about it.  She is pretty sure much of this is related to work related stress and issues with her parents.  She does continue to smoke unfortunately.  No chest pains.   Current Outpatient Prescriptions  Medication Sig Dispense Refill  . acetaminophen (TYLENOL) 325 MG tablet Take 325 mg by mouth as needed.        . calcium carbonate 200 MG capsule Take 250 mg by mouth 2 (two) times daily with a meal.      . guaiFENesin (MUCINEX) 600 MG 12 hr tablet Take 600 mg by mouth as needed.        Marland Kitchen ibuprofen (ADVIL,MOTRIN) 200 MG tablet Take 200 mg by mouth as needed.        . Multiple Vitamins-Minerals (MULTIVITAMIN WITH MINERALS) tablet Take 1 tablet by mouth daily.        . OMEGA-3 KRILL OIL 300 MG CAPS Take 300 mg by mouth.        . tretinoin (RETIN-A) 0.025 % cream Apply topically at bedtime.          Allergies  Allergen Reactions  . Other     Red food Coloring  . Penicillins   . Sulfonamide Derivatives     Past Medical History  Diagnosis Date  . History of tobacco abuse   . Pyelonephritis   . Ovarian cyst   . Interstitial cystitis   . Panic disorder   . Endometriosis   . Arrhythmia     Bradycardia  . Sinus problem     Right maxillary (frequent)    Past Surgical History  Procedure Date  . Appendectomy   . Laparoscopy     Family History  Problem Relation Age of Onset  . Cancer Neg Hx     No obvious cancer    History   Social History  . Marital Status: Single    Spouse Name: N/A    Number of Children: 2  . Years of Education: N/A   Occupational History  . Dental technician    Social History Main Topics  . Smoking status: Current Everyday Smoker -- 11 years  . Smokeless tobacco: Not on file  . Alcohol Use: No  . Drug Use: No  . Sexually Active:    Other Topics Concern  . Not on file   Social History Narrative   Started smoking  related to the tension of her first marriage and 2 children.    ROS: Please see the HPI.  All other systems reviewed and negative.  PHYSICAL EXAM:  BP 110/72  Pulse 81  Ht 5\' 4"  (1.626 m)  Wt 71.668 kg (158 lb)  BMI 27.12 kg/m2  General: Well developed, well nourished, in no acute distress. Head:  Normocephalic and atraumatic. Neck: no JVD Lungs: Clear to auscultation and percussion. Heart: Normal S1 and S2.  No murmur, rubs or gallops.  Pulses: Pulses normal in all 4 extremities. Extremities: No clubbing or cyanosis. No edema. Neurologic: Alert and oriented x 3.  EKG:  NSR.  Sinus arrhythmia.  No acute changes.    ASSESSMENT AND PLAN:

## 2011-11-03 ENCOUNTER — Encounter: Payer: Self-pay | Admitting: Cardiology

## 2011-11-09 ENCOUNTER — Telehealth: Payer: Self-pay | Admitting: *Deleted

## 2011-11-09 DIAGNOSIS — N760 Acute vaginitis: Secondary | ICD-10-CM

## 2011-11-09 MED ORDER — METRONIDAZOLE 500 MG PO TABS: 500.0000 mg | ORAL_TABLET | Freq: Three times a day (TID) | ORAL | Status: AC

## 2011-11-09 NOTE — Telephone Encounter (Signed)
Patient is having increased irritation and discharge.  She is not able to get in for an appointment until next week and she has an appointment for her Physical in March.  I will call in Metronidazole for her to help out as she states this happens after intercourse since her detachable shower head broke.  She will get a new shower head and take this medicine and keep her appointment in March.

## 2011-11-13 NOTE — Assessment & Plan Note (Signed)
Much improved.  Continue to observe.

## 2011-11-13 NOTE — Assessment & Plan Note (Signed)
Discussion again today about this issue. She understands the importance of this.

## 2011-11-13 NOTE — Assessment & Plan Note (Signed)
Mostly resolved

## 2011-12-03 ENCOUNTER — Ambulatory Visit (INDEPENDENT_AMBULATORY_CARE_PROVIDER_SITE_OTHER): Payer: PRIVATE HEALTH INSURANCE | Admitting: Obstetrics & Gynecology

## 2011-12-03 ENCOUNTER — Encounter: Payer: Self-pay | Admitting: Obstetrics & Gynecology

## 2011-12-03 VITALS — BP 107/67 | HR 85 | Ht 64.0 in | Wt 155.0 lb

## 2011-12-03 DIAGNOSIS — R5381 Other malaise: Secondary | ICD-10-CM

## 2011-12-03 DIAGNOSIS — Z800 Family history of malignant neoplasm of digestive organs: Secondary | ICD-10-CM

## 2011-12-03 DIAGNOSIS — N9489 Other specified conditions associated with female genital organs and menstrual cycle: Secondary | ICD-10-CM

## 2011-12-03 DIAGNOSIS — Z113 Encounter for screening for infections with a predominantly sexual mode of transmission: Secondary | ICD-10-CM

## 2011-12-03 DIAGNOSIS — Z124 Encounter for screening for malignant neoplasm of cervix: Secondary | ICD-10-CM

## 2011-12-03 DIAGNOSIS — Z803 Family history of malignant neoplasm of breast: Secondary | ICD-10-CM

## 2011-12-03 DIAGNOSIS — R5383 Other fatigue: Secondary | ICD-10-CM

## 2011-12-03 DIAGNOSIS — Z0000 Encounter for general adult medical examination without abnormal findings: Secondary | ICD-10-CM

## 2011-12-03 DIAGNOSIS — N898 Other specified noninflammatory disorders of vagina: Secondary | ICD-10-CM

## 2011-12-03 DIAGNOSIS — B379 Candidiasis, unspecified: Secondary | ICD-10-CM

## 2011-12-03 DIAGNOSIS — F410 Panic disorder [episodic paroxysmal anxiety] without agoraphobia: Secondary | ICD-10-CM

## 2011-12-03 DIAGNOSIS — B49 Unspecified mycosis: Secondary | ICD-10-CM

## 2011-12-03 MED ORDER — FLUCONAZOLE 150 MG PO TABS: 150.0000 mg | ORAL_TABLET | Freq: Once | ORAL | Status: AC

## 2011-12-03 NOTE — Progress Notes (Signed)
Subjective:    [name] is a 36 y.o. female who presents for an annual exam. She complains of panic attacks and stress and is requesting xanax. I have told her that she needs a psychiatrist for this issue.  The patient is sexually active. GYN screening history: last pap: was normal. The patient wears seatbelts: yes. The patient participates in regular exercise: no. Has the patient ever been transfused or tattooed?: yes.(tattoo) The patient reports that there is not domestic violence in her life.   Menstrual History: OB History    Grav Para Term Preterm Abortions TAB SAB Ect Mult Living   5 4        4       Menarche age: 75 No LMP recorded. Patient is not currently having periods (Reason: IUD).    The following portions of the patient's history were reviewed and updated as appropriate: allergies, current medications, past family history, past medical history, past social history, past surgical history and problem list.  Review of Systems A comprehensive review of systems was negative. She is married and reports positional dysparunia. She goes to [facility] and is planning to have PT for her bladder issues. Her husband had a vasectomy 2 months ago but has not had his sperm test yet. She likes having no periods with the Mirena but thinks that is is causing some of her dysparunia. She also complains of vaginal dryness and wants "hormones tested."  Objective:    BP 107/67  Pulse 85  Ht 5\' 4"  (1.626 m)  Wt 155 lb (70.308 kg)  BMI 26.61 kg/m2  General Appearance:    Alert, cooperative, no distress, appears stated age  Head:    Normocephalic, without obvious abnormality, atraumatic  Eyes:    PERRL, conjunctiva/corneas clear, EOM's intact, fundi    benign, both eyes  Ears:    Normal TM's and external ear canals, both ears  Nose:   Nares normal, septum midline, mucosa normal, no drainage    or sinus tenderness  Throat:   Lips, mucosa, and tongue normal; teeth and gums normal  Neck:    Supple, symmetrical, trachea midline, no adenopathy;    thyroid:  no enlargement/tenderness/nodules; no carotid   bruit or JVD  Back:     Symmetric, no curvature, ROM normal, no CVA tenderness  Lungs:     Clear to auscultation bilaterally, respirations unlabored  Chest Wall:    No tenderness or deformity   Heart:    Regular rate and rhythm, S1 and S2 normal, no murmur, rub   or gallop  Breast Exam:    No tenderness, masses, or nipple abnormality  Abdomen:     Soft, non-tender, bowel sounds active all four quadrants,    no masses, no organomegaly  Genitalia:    Normal female without lesion, discharge or tenderness, some dryness, but normal mucosa, NSSrv, tender t/o, no masses, 1st degree prolapse of uterus     Extremities:   Extremities normal, atraumatic, no cyanosis or edema  Pulses:   2+ and symmetric all extremities  Skin:   Skin color, texture, turgor normal, no rashes or lesions  Lymph nodes:   Cervical, supraclavicular, and axillary nodes normal  Neurologic:   CNII-XII intact, normal strength, sensation and reflexes    throughout  .    Assessment:    Healthy female exam.    Plan:     Pap smear.  I will check FSH per her request

## 2011-12-03 NOTE — Progress Notes (Signed)
Addended by: Barbara Cower on: 12/03/2011 02:51 PM   Modules accepted: Orders

## 2011-12-03 NOTE — Progress Notes (Signed)
Addended by: Barbara Cower on: 12/03/2011 03:55 PM   Modules accepted: Orders

## 2011-12-03 NOTE — Progress Notes (Signed)
Addended by: Barbara Cower on: 12/03/2011 03:58 PM   Modules accepted: Orders

## 2011-12-04 LAB — CBC WITH DIFFERENTIAL/PLATELET
Basophils Absolute: 0 10*3/uL (ref 0.0–0.1)
Basophils Relative: 1 % (ref 0–1)
Eosinophils Relative: 1 % (ref 0–5)
HCT: 42.9 % (ref 36.0–46.0)
MCH: 32.1 pg (ref 26.0–34.0)
MCHC: 33.6 g/dL (ref 30.0–36.0)
MCV: 95.5 fL (ref 78.0–100.0)
Monocytes Absolute: 0.4 10*3/uL (ref 0.1–1.0)
RDW: 12.2 % (ref 11.5–15.5)

## 2011-12-04 LAB — COMPREHENSIVE METABOLIC PANEL
ALT: 13 U/L (ref 0–35)
AST: 20 U/L (ref 0–37)
Alkaline Phosphatase: 73 U/L (ref 39–117)
Chloride: 104 mEq/L (ref 96–112)
Creat: 0.69 mg/dL (ref 0.50–1.10)
Total Bilirubin: 0.3 mg/dL (ref 0.3–1.2)

## 2011-12-04 LAB — FOLLICLE STIMULATING HORMONE: FSH: 3.7 m[IU]/mL

## 2011-12-04 LAB — TSH: TSH: 2.145 u[IU]/mL (ref 0.350–4.500)

## 2011-12-04 LAB — VITAMIN B12: Vitamin B-12: 1454 pg/mL — ABNORMAL HIGH (ref 211–911)

## 2011-12-04 LAB — LIPID PANEL: HDL: 59 mg/dL (ref >39)

## 2011-12-08 LAB — VITAMIN D 1,25 DIHYDROXY
Vitamin D 1, 25 (OH)2 Total: 33 pg/mL (ref 18–72)
Vitamin D2 1, 25 (OH)2: <8 pg/mL

## 2011-12-11 IMAGING — US US PELV - US TRANSVAGINAL
1 series · 14 of 25 positions shown · non-contrast
Comparison: No similar prior study is available for comparison.

CLINICAL DATA: Verify IUD position.  Prior ovarian cystectomy.
History of endometriosis.



[Series 1: us pelvis complete + us transvaginal non-ob · 14 of 80 slices shown]
[im 1/80]
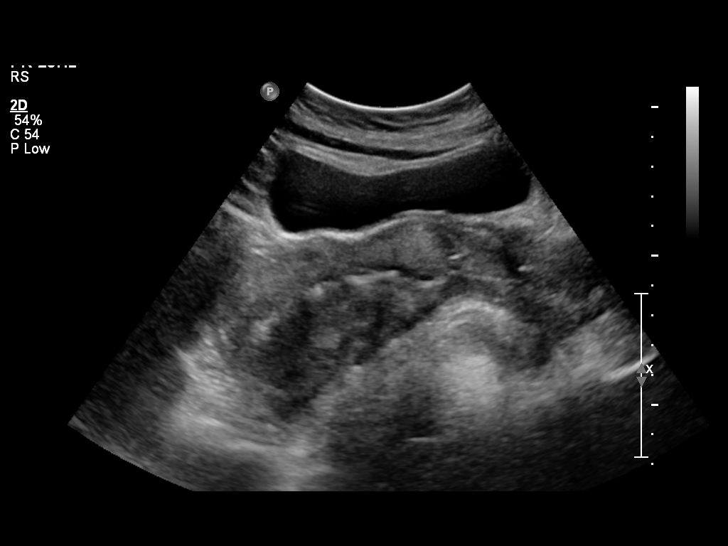
[im 7/80]
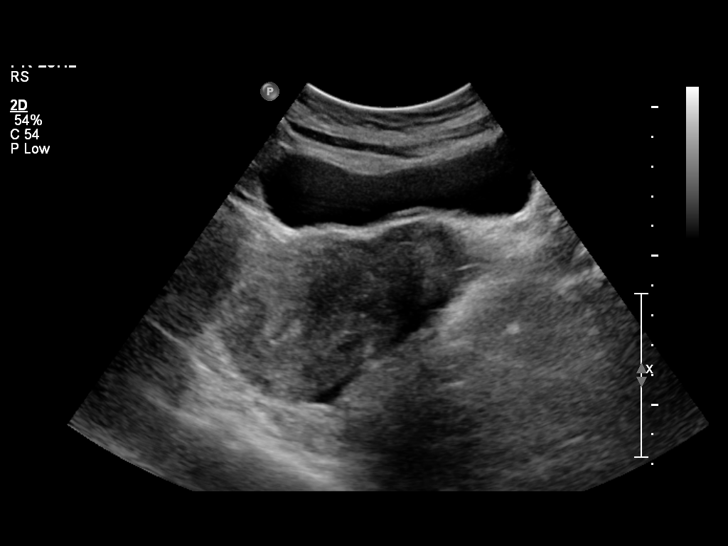
[im 14/80]
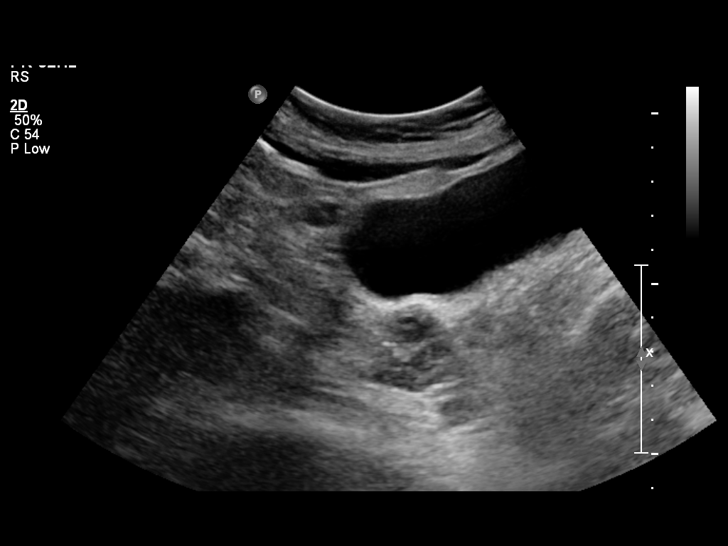
[im 20/80]
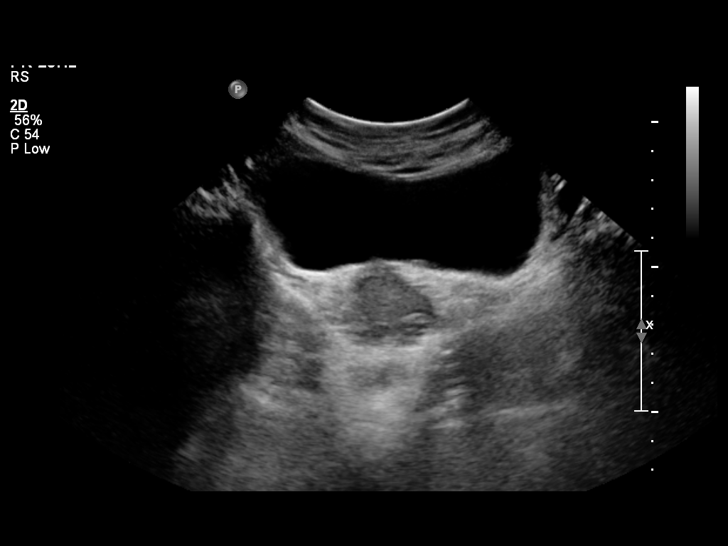
[im 27/80]
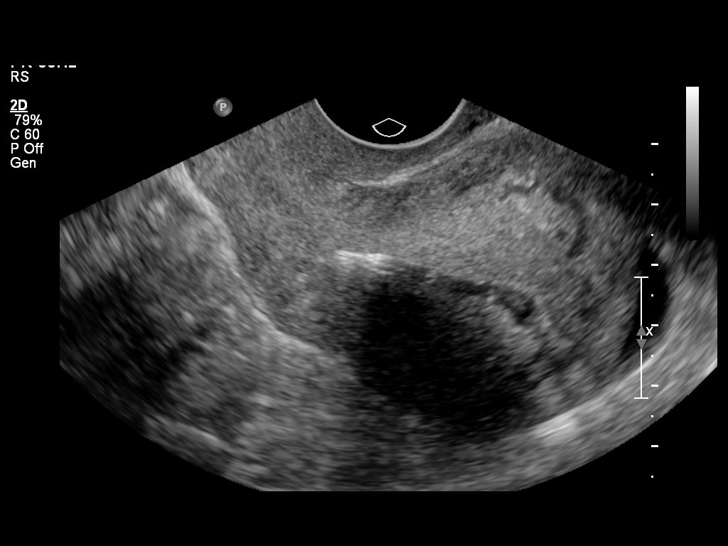
[im 30/80]
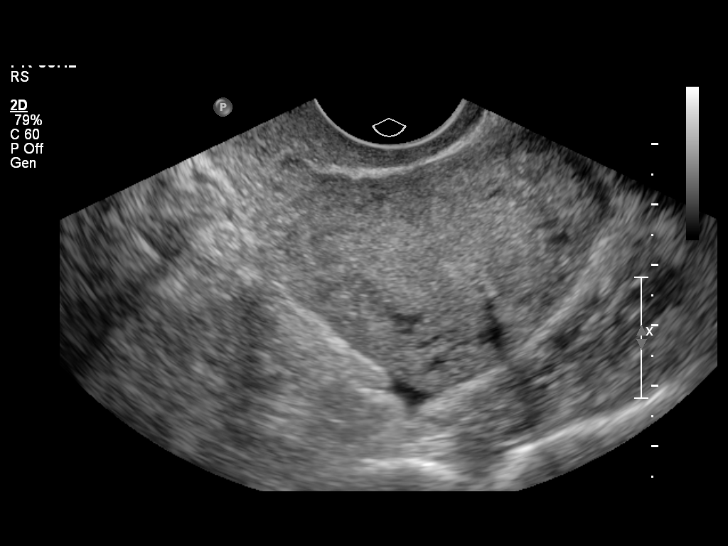
[im 37/80]
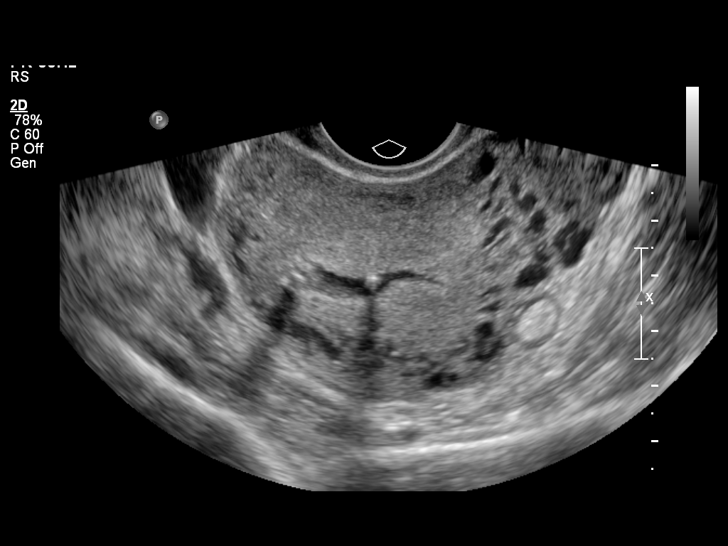
[im 43/80]
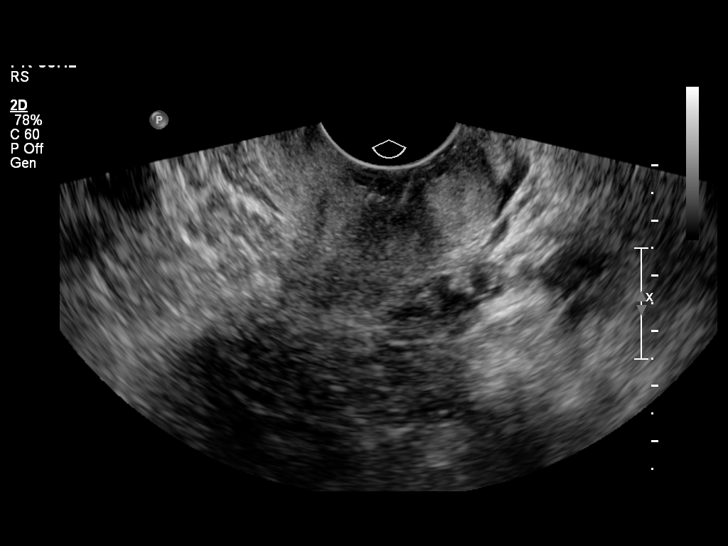
[im 50/80]
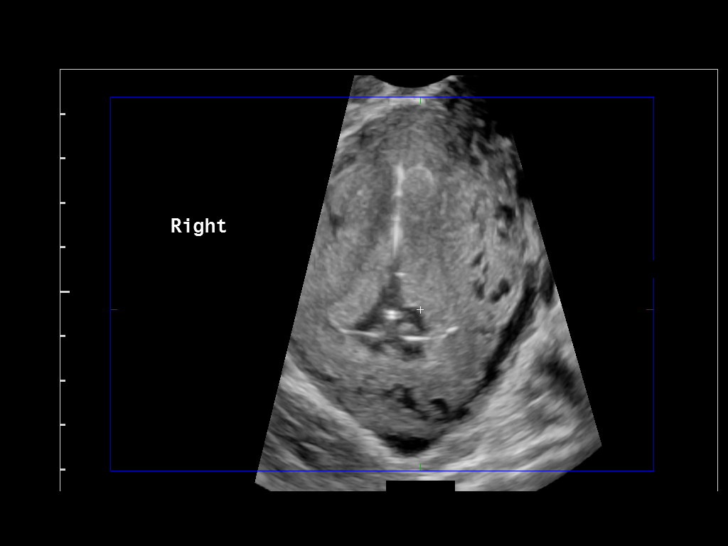
[im 53/80]
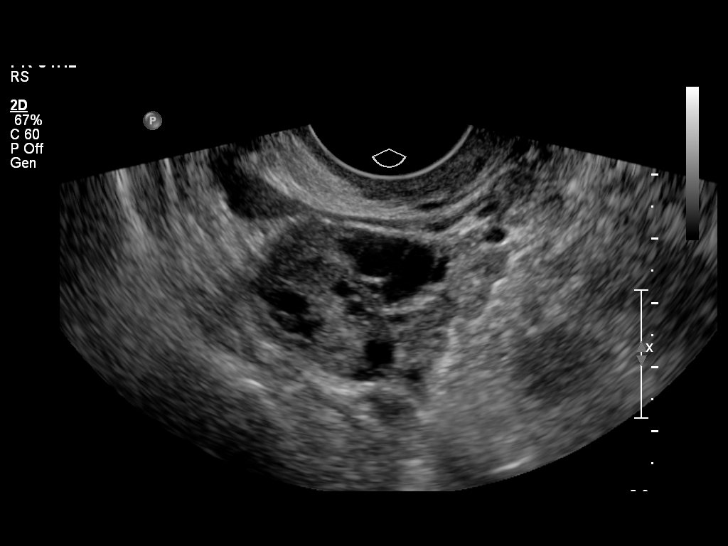
[im 60/80]
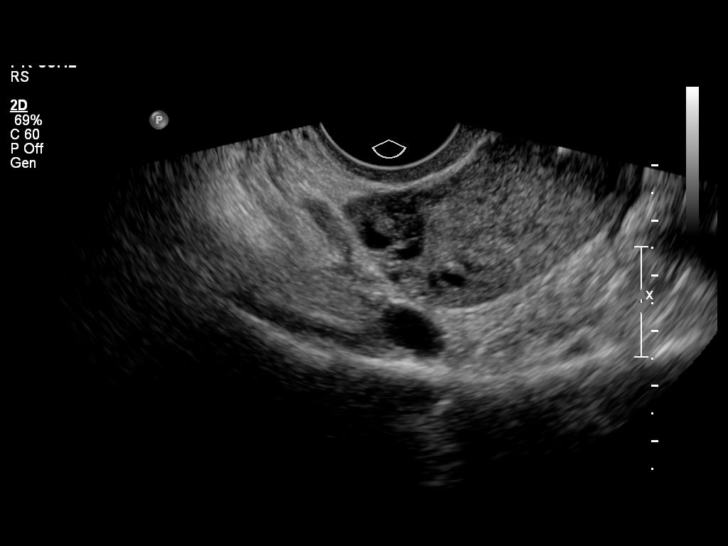
[im 66/80]
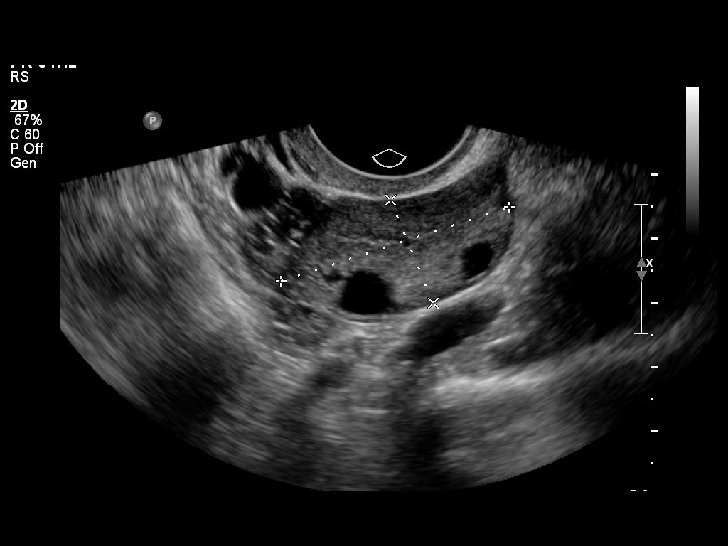
[im 73/80]
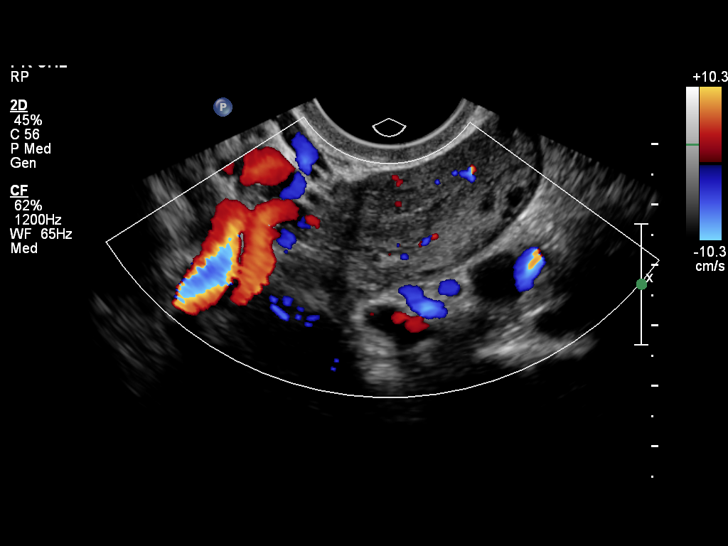
[im 80/80]
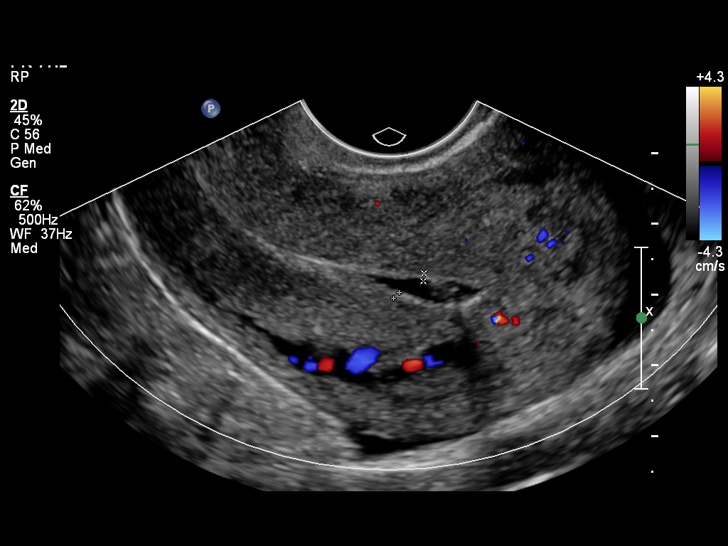

[14 of 25 positions shown; findings below may reference images not displayed]

FINDINGS: Uterus 8.1 x 5.3 x 4.8 cm.  Retroverted, retroflexed.

Endometrium measures 0.2 cm, uniformly thin and echogenic.  IUD in
place within the fundal endometrial canal.  Trace fluid within the
endometrium is incidentally noted.

Right Ovary 2.9 x 1.7 x 1.4 cm.  Normal.

Left Ovary 3.7 x 2.6 x 1.7 cm.  Normal.

Other Findings:  Small amount of free fluid is incidentally noted.
IMPRESSION: IUD appropriately located.  Otherwise normal exam.

## 2011-12-15 ENCOUNTER — Institutional Professional Consult (permissible substitution): Payer: PRIVATE HEALTH INSURANCE | Admitting: Nurse Practitioner

## 2011-12-29 ENCOUNTER — Ambulatory Visit (INDEPENDENT_AMBULATORY_CARE_PROVIDER_SITE_OTHER): Payer: PRIVATE HEALTH INSURANCE | Admitting: Nurse Practitioner

## 2011-12-29 ENCOUNTER — Encounter: Payer: Self-pay | Admitting: Nurse Practitioner

## 2011-12-29 VITALS — BP 115/75 | HR 110 | Ht 60.0 in | Wt 156.0 lb

## 2011-12-29 DIAGNOSIS — F411 Generalized anxiety disorder: Secondary | ICD-10-CM

## 2011-12-29 DIAGNOSIS — F419 Anxiety disorder, unspecified: Secondary | ICD-10-CM

## 2011-12-29 DIAGNOSIS — G4700 Insomnia, unspecified: Secondary | ICD-10-CM

## 2011-12-29 DIAGNOSIS — G43009 Migraine without aura, not intractable, without status migrainosus: Secondary | ICD-10-CM | POA: Insufficient documentation

## 2011-12-29 MED ORDER — ZOLPIDEM TARTRATE 10 MG PO TABS: 10.0000 mg | ORAL_TABLET | Freq: Every evening | ORAL | Status: DC | PRN

## 2011-12-29 MED ORDER — SERTRALINE HCL 50 MG PO TABS: 50.0000 mg | ORAL_TABLET | Freq: Every day | ORAL | Status: DC

## 2011-12-29 MED ORDER — RIZATRIPTAN BENZOATE 10 MG PO TABS: 10.0000 mg | ORAL_TABLET | Freq: Once | ORAL | Status: DC | PRN

## 2011-12-29 NOTE — Patient Instructions (Signed)

## 2011-12-29 NOTE — Progress Notes (Signed)
Patient is here for headaches and not able to sleep.  She does have a history of sinus tachycardia and bradycardia her pulse ranges from 30-40 bpm to 130 resting pulse. Her cardiologist recomended she talk to someone regarding her headaches and anxiety.

## 2011-12-29 NOTE — Progress Notes (Signed)
Diagnosis: migraine without aura  History: Pt has had headache for years have become more frequent and severe in last few years. C/O photosensitivity and phonophobia as well as nausea and anorexia. Denies any visual patterns to indicate aura. She has a sinus/ Tachy cardiac syndrome of some type. She has had a full evaluation by Cardiology and all results were negative. The Cardiologist feels she has a" sensitive vagus nerve". She does smoke cigarettes and would like to quit but feels very anxious. She has history of panic attacks and on ocasion has taken xanax with good results. She is quite stressed with full time work and 4 small children. In addition she helps with the care of her partners grandparents. She has been diagnosed with multiple sinus infections in past. Her sleep in poor likely due to anxiety. She has difficulty falling asleep and difficulty remaining asleep. She has taken Ambien in the past with good results.  Location: Can be either left or right temple. Rare Occipital  Number of Headache days/month: Severe: 4 Moderate 6 Mild:3  Current Outpatient Prescriptions on File Prior to Visit  Medication Sig Dispense Refill  . calcium carbonate 200 MG capsule Take 250 mg by mouth 2 (two) times daily with a meal.      . ibuprofen (ADVIL,MOTRIN) 200 MG tablet Take 200 mg by mouth as needed.        . Multiple Vitamins-Minerals (MULTIVITAMIN WITH MINERALS) tablet Take 1 tablet by mouth daily.        . OMEGA-3 KRILL OIL 300 MG CAPS Take 300 mg by mouth.        . tretinoin (RETIN-A) 0.025 % cream Apply topically at bedtime.        . rizatriptan (MAXALT) 10 MG tablet Take 1 tablet (10 mg total) by mouth once as needed for migraine. May repeat in 2 hours if needed  12 tablet  11  . sertraline (ZOLOFT) 50 MG tablet Take 1 tablet (50 mg total) by mouth daily.  30 tablet  6  . zolpidem (AMBIEN) 10 MG tablet Take 1 tablet (10 mg total) by mouth at bedtime as needed for sleep.  30 tablet  3     Acute prevention: None, take advil only for headache  Past Medical History  Diagnosis Date  . History of tobacco abuse   . Pyelonephritis   . Ovarian cyst   . Interstitial cystitis   . Panic disorder   . Endometriosis   . Arrhythmia     Bradycardia  . Sinus problem     Right maxillary (frequent)  . MRSA (methicillin resistant staph aureus) culture positive 2013   Past Surgical History  Procedure Date  . Appendectomy   . Laparoscopy    Family History  Problem Relation Age of Onset  . Cancer Neg Hx     No obvious cancer   Social History:  reports that she has been smoking.  She does not have any smokeless tobacco history on file. She reports that she does not drink alcohol or use illicit drugs. Allergies:  Allergies  Allergen Reactions  . Other     Red food Coloring  . Penicillins   . Sulfonamide Derivatives     Triggers: stress  Birth control: Mirenia IUD  ROS: positive for headaches, anxiety, insomnia, right hip pains, sinus infections, sinus tacy-brady   Exam: Well developed, well nourished 36 yo caucasian female  General: NAD HEENT: Negative Cardiac: RRR, no tachycardia now Lungs: Clear Neuro: Anxious otherwise negative Skin: Warm  and dry  Impression:migraine - common  Plan: We discussed the pathophysiology of migraine and the triggers for migraine. Hers are clearly stress. She does not have the time or money now for massage, counseling or other nonmedical therapies. She will be given Maxalt to take when she gets an acute migraine.  We will start her on Zoloft at bedtime for anxiety. She will start with 1/2 tab then move up to whole tab if needed. She is also given ambien to take on an as needed basis for insomnia. We will schedule her for MRI of brain to include sinuses. She has been told she has chronic sinus infections. RTC 8 weeks  Time Spent: one hour

## 2011-12-31 NOTE — Progress Notes (Signed)
Addended by: Barbara Cower on: 12/31/2011 10:11 AM   Modules accepted: Orders

## 2012-01-06 ENCOUNTER — Telehealth: Payer: Self-pay | Admitting: *Deleted

## 2012-01-06 DIAGNOSIS — N390 Urinary tract infection, site not specified: Secondary | ICD-10-CM

## 2012-01-06 DIAGNOSIS — B373 Candidiasis of vulva and vagina: Secondary | ICD-10-CM

## 2012-01-06 MED ORDER — CIPROFLOXACIN HCL 500 MG PO TABS: 500.0000 mg | ORAL_TABLET | Freq: Two times a day (BID) | ORAL | Status: AC

## 2012-01-06 MED ORDER — FLUCONAZOLE 150 MG PO TABS: 150.0000 mg | ORAL_TABLET | Freq: Once | ORAL | Status: AC

## 2012-01-06 NOTE — Telephone Encounter (Signed)
Patient has started having burning and stinging with urination since yesterday.  She also is having a clumpy white discharge and irritation in her vaginal area.  She would like something called in as she is unable to miss work to come for an appointment.  She will follow up if symptoms persist and I advised her she would have to be seen at this time.

## 2012-01-08 ENCOUNTER — Ambulatory Visit (HOSPITAL_COMMUNITY)
Admission: RE | Admit: 2012-01-08 | Discharge: 2012-01-08 | Disposition: A | Discharge status: Home / Self Care | Payer: PRIVATE HEALTH INSURANCE | Source: Ambulatory Visit | Attending: Nurse Practitioner | Admitting: Nurse Practitioner

## 2012-01-08 DIAGNOSIS — G43909 Migraine, unspecified, not intractable, without status migrainosus: Secondary | ICD-10-CM | POA: Insufficient documentation

## 2012-01-08 MED ORDER — GADOBENATE DIMEGLUMINE 529 MG/ML IV SOLN
14.0000 mL | Freq: Once | INTRAVENOUS | Status: AC
  Administered 2012-01-08: 14 mL via INTRAVENOUS

## 2012-02-16 ENCOUNTER — Encounter: Payer: Self-pay | Admitting: Nurse Practitioner

## 2012-02-16 ENCOUNTER — Ambulatory Visit (INDEPENDENT_AMBULATORY_CARE_PROVIDER_SITE_OTHER): Payer: PRIVATE HEALTH INSURANCE | Admitting: Nurse Practitioner

## 2012-02-16 DIAGNOSIS — G43109 Migraine with aura, not intractable, without status migrainosus: Secondary | ICD-10-CM

## 2012-02-16 MED ORDER — ALPRAZOLAM 0.25 MG PO TABS: 0.2500 mg | ORAL_TABLET | Freq: Every evening | ORAL | Status: AC | PRN

## 2012-02-16 MED ORDER — SERTRALINE HCL 100 MG PO TABS: 100.0000 mg | ORAL_TABLET | Freq: Every day | ORAL | Status: DC

## 2012-02-16 NOTE — Progress Notes (Signed)
S: Pt is in office today for return visit for migraine. She is doing better. Her MRI of brain was negative including for sinus infections. She is doing better on zoloft but not as well as she would like. She is willing to go up on dose. Maxalt is working well for her.   O: General negative Cardiac negative Lungs clear Neuro negative  A: Migraine  Anxiety  P: Will go up on Zoloft 100mg , Add Xanax on prn basis. Continue Maxat and add advil. RTC 6 months

## 2012-02-16 NOTE — Patient Instructions (Signed)

## 2012-03-11 ENCOUNTER — Encounter: Payer: Self-pay | Admitting: Obstetrics & Gynecology

## 2012-05-27 ENCOUNTER — Encounter: Payer: Self-pay | Admitting: Cardiology

## 2012-05-27 ENCOUNTER — Ambulatory Visit (INDEPENDENT_AMBULATORY_CARE_PROVIDER_SITE_OTHER): Payer: PRIVATE HEALTH INSURANCE | Admitting: Cardiology

## 2012-05-27 VITALS — BP 126/64 | HR 76 | Ht 64.0 in | Wt 155.0 lb

## 2012-05-27 DIAGNOSIS — R002 Palpitations: Secondary | ICD-10-CM

## 2012-05-27 DIAGNOSIS — I498 Other specified cardiac arrhythmias: Secondary | ICD-10-CM

## 2012-05-27 DIAGNOSIS — F17200 Nicotine dependence, unspecified, uncomplicated: Secondary | ICD-10-CM

## 2012-05-27 NOTE — Assessment & Plan Note (Signed)
No further episodes.  Supportive care.  She would like to continue to follow with our office----will arrange to have Dr. Mariah Milling see her in follow up.

## 2012-05-27 NOTE — Progress Notes (Signed)
HPI:  Patient returns in followup. She's been very stable. She has seen a counselor, largely for help with her panic disorder. She is taking perhaps 3 alprazolam's over the past six months.  She otherwise is stable.  Denies any chest pain or shortness of breath.     Current Outpatient Prescriptions  Medication Sig Dispense Refill  . ALPRAZolam (XANAX) 0.25 MG tablet Take 0.25 mg by mouth at bedtime as needed.      . calcium carbonate 200 MG capsule Take 250 mg by mouth 2 (two) times daily with a meal.      . ibuprofen (ADVIL,MOTRIN) 200 MG tablet Take 200 mg by mouth as needed.        . Multiple Vitamins-Minerals (MULTIVITAMIN WITH MINERALS) tablet Take 1 tablet by mouth daily.        . rizatriptan (MAXALT) 10 MG tablet Take 1 tablet (10 mg total) by mouth once as needed for migraine. May repeat in 2 hours if needed  12 tablet  11  . sertraline (ZOLOFT) 100 MG tablet Take 1 tablet (100 mg total) by mouth daily.  30 tablet  6  . tretinoin (RETIN-A) 0.025 % cream Apply topically at bedtime.        Marland Kitchen zolpidem (AMBIEN) 10 MG tablet Take 1 tablet (10 mg total) by mouth at bedtime as needed for sleep.  30 tablet  3    Allergies  Allergen Reactions  . Other     Red food Coloring  . Penicillins   . Sulfonamide Derivatives     Past Medical History  Diagnosis Date  . History of tobacco abuse   . Pyelonephritis   . Ovarian cyst   . Interstitial cystitis   . Panic disorder   . Endometriosis   . Arrhythmia     Bradycardia  . Sinus problem     Right maxillary (frequent)  . MRSA (methicillin resistant staph aureus) culture positive 2013    Past Surgical History  Procedure Date  . Appendectomy   . Laparoscopy     Family History  Problem Relation Age of Onset  . Cancer Neg Hx     No obvious cancer    History   Social History  . Marital Status: Single    Spouse Name: N/A    Number of Children: 2  . Years of Education: N/A   Occupational History  . Dental technician     Social History Main Topics  . Smoking status: Current Everyday Smoker -- 11 years  . Smokeless tobacco: Not on file  . Alcohol Use: No  . Drug Use: No  . Sexually Active: Yes -- Female partner(s)    Birth Control/ Protection: IUD   Other Topics Concern  . Not on file   Social History Narrative   Started smoking related to the tension of her first marriage and 2 children.    ROS: Please see the HPI.  All other systems reviewed and negative.  PHYSICAL EXAM:  BP 126/64  Pulse 76  Ht 5\' 4"  (1.626 m)  Wt 155 lb (70.308 kg)  BMI 26.61 kg/m2  General: Well developed, well nourished, in no acute distress. Head:  Normocephalic and atraumatic. Neck: no JVD Lungs: Clear to auscultation and percussion. Heart: Normal S1 and S2.  No murmur, rubs or gallops.  Pulses: Pulses normal in all 4 extremities. Extremities: No clubbing or cyanosis. No edema. Neurologic: Alert and oriented x 3.  EKG:  NSR.  IRBBB.  No change from prior  tracing.    Prior echo  Study Conclusions Left ventricle: The cavity size was normal. Wall thickness was normal. Systolic function was normal. The estimated ejection fraction was in the range of 60% to 65%. Wall motion was normal; there were no regional wall motion abnormalities. Left ventricular diastolic function parameters were normal.      ASSESSMENT AND PLAN:

## 2012-05-27 NOTE — Patient Instructions (Addendum)
Your physician wants you to follow-up in: 1 YEAR with Dr Mariah Milling. You will receive a reminder letter in the mail two months in advance. If you don't receive a letter, please call our office to schedule the follow-up appointment.  Your physician recommends that you continue on your current medications as directed. Please refer to the Current Medication list given to you today.

## 2012-05-27 NOTE — Assessment & Plan Note (Signed)
She continues to smoke.  I have encouraged her to quit.

## 2012-07-12 ENCOUNTER — Encounter: Payer: Self-pay | Admitting: Nurse Practitioner

## 2012-07-12 ENCOUNTER — Ambulatory Visit (INDEPENDENT_AMBULATORY_CARE_PROVIDER_SITE_OTHER): Payer: PRIVATE HEALTH INSURANCE | Admitting: Nurse Practitioner

## 2012-07-12 VITALS — BP 122/66 | HR 74 | Ht 64.0 in | Wt 157.0 lb

## 2012-07-12 DIAGNOSIS — F419 Anxiety disorder, unspecified: Secondary | ICD-10-CM

## 2012-07-12 DIAGNOSIS — Z111 Encounter for screening for respiratory tuberculosis: Secondary | ICD-10-CM

## 2012-07-12 DIAGNOSIS — G43909 Migraine, unspecified, not intractable, without status migrainosus: Secondary | ICD-10-CM

## 2012-07-12 DIAGNOSIS — F411 Generalized anxiety disorder: Secondary | ICD-10-CM

## 2012-07-12 DIAGNOSIS — Z23 Encounter for immunization: Secondary | ICD-10-CM

## 2012-07-12 DIAGNOSIS — J029 Acute pharyngitis, unspecified: Secondary | ICD-10-CM

## 2012-07-12 MED ORDER — AZITHROMYCIN 250 MG PO TABS: ORAL_TABLET | ORAL | Status: AC

## 2012-07-12 MED ORDER — SERTRALINE HCL 50 MG PO TABS: 50.0000 mg | ORAL_TABLET | Freq: Three times a day (TID) | ORAL | Status: DC

## 2012-07-12 MED ORDER — RIZATRIPTAN BENZOATE 10 MG PO TABS: 10.0000 mg | ORAL_TABLET | Freq: Once | ORAL | Status: DC | PRN

## 2012-07-12 MED ORDER — ALPRAZOLAM 0.25 MG PO TABS: 0.2500 mg | ORAL_TABLET | Freq: Every evening | ORAL | Status: DC | PRN

## 2012-07-12 NOTE — Progress Notes (Signed)
S: Pt is in office today for follow up on migraine headache. She has been very stressed with house moving with 4 children. Several of her children have strep throat and she is fearful she will get that as well and is requesting z pack. Needs refill on medications. She has gone up to 150mg  Zoloft on her own.  O: Alert, oriented, NAD caucasian female Neuro: negative Skin: warm and dry  A: Migraine Anxiety  P: Refill meds including zoloft, maxalt, xanax and ok to give her z-pack for ? strep

## 2012-07-12 NOTE — Patient Instructions (Signed)
Migraine Headache A migraine headache is an intense, throbbing pain on one or both sides of your head. A migraine can last for 30 minutes to several hours. CAUSES  The exact cause of a migraine headache is not always known. However, a migraine may be caused when nerves in the brain become irritated and release chemicals that cause inflammation. This causes pain. SYMPTOMS  Pain on one or both sides of your head.  Pulsating or throbbing pain.  Severe pain that prevents daily activities.  Pain that is aggravated by any physical activity.  Nausea, vomiting, or both.  Dizziness.  Pain with exposure to bright lights, loud noises, or activity.  General sensitivity to bright lights, loud noises, or smells. Before you get a migraine, you may get warning signs that a migraine is coming (aura). An aura may include:  Seeing flashing lights.  Seeing bright spots, halos, or zig-zag lines.  Having tunnel vision or blurred vision.  Having feelings of numbness or tingling.  Having trouble talking.  Having muscle weakness. MIGRAINE TRIGGERS  Alcohol.  Smoking.  Stress.  Menstruation.  Aged cheeses.  Foods or drinks that contain nitrates, glutamate, aspartame, or tyramine.  Lack of sleep.  Chocolate.  Caffeine.  Hunger.  Physical exertion.  Fatigue.  Medicines used to treat chest pain (nitroglycerine), birth control pills, estrogen, and some blood pressure medicines. DIAGNOSIS  A migraine headache is often diagnosed based on:  Symptoms.  Physical examination.  A CT scan or MRI of your head. TREATMENT Medicines may be given for pain and nausea. Medicines can also be given to help prevent recurrent migraines.  HOME CARE INSTRUCTIONS  Only take over-the-counter or prescription medicines for pain or discomfort as directed by your caregiver. The use of long-term narcotics is not recommended.  Lie down in a dark, quiet room when you have a migraine.  Keep a journal  to find out what may trigger your migraine headaches. For example, write down:  What you eat and drink.  How much sleep you get.  Any change to your diet or medicines.  Limit alcohol consumption.  Quit smoking if you smoke.  Get 7 to 9 hours of sleep, or as recommended by your caregiver.  Limit stress.  Keep lights dim if bright lights bother you and make your migraines worse. SEEK IMMEDIATE MEDICAL CARE IF:   Your migraine becomes severe.  You have a fever.  You have a stiff neck.  You have vision loss.  You have muscular weakness or loss of muscle control.  You start losing your balance or have trouble walking.  You feel faint or pass out.  You have severe symptoms that are different from your first symptoms. MAKE SURE YOU:   Understand these instructions.  Will watch your condition.  Will get help right away if you are not doing well or get worse. Document Released: 09/07/2005 Document Revised: 11/30/2011 Document Reviewed: 08/28/2011 Exit[employer] Patient Information 2013 [employer].  

## 2012-10-07 ENCOUNTER — Other Ambulatory Visit (INDEPENDENT_AMBULATORY_CARE_PROVIDER_SITE_OTHER): Payer: PRIVATE HEALTH INSURANCE | Admitting: *Deleted

## 2012-10-07 DIAGNOSIS — Z111 Encounter for screening for respiratory tuberculosis: Secondary | ICD-10-CM

## 2012-10-07 NOTE — Progress Notes (Signed)
Patient is here to have lab redrawn due to error in the tube sent to the lab on my part.  No charge for todays visit.

## 2012-10-10 LAB — QUANTIFERON IN TUBE
QFT TB AG MINUS NIL VALUE: 0 [IU]/mL
QUANTIFERON MITOGEN VALUE: 2.55 [IU]/mL
QUANTIFERON TB AG VALUE: 0.03 [IU]/mL
QUANTIFERON TB GOLD: NEGATIVE
Quantiferon Nil Value: 0.03 [IU]/mL

## 2012-10-10 LAB — QUANTIFERON TB GOLD ASSAY (BLOOD)

## 2012-10-11 ENCOUNTER — Other Ambulatory Visit: Payer: Self-pay | Admitting: Nurse Practitioner

## 2013-01-17 ENCOUNTER — Other Ambulatory Visit (INDEPENDENT_AMBULATORY_CARE_PROVIDER_SITE_OTHER): Payer: No Typology Code available for payment source | Admitting: *Deleted

## 2013-01-17 DIAGNOSIS — N390 Urinary tract infection, site not specified: Secondary | ICD-10-CM

## 2013-01-17 LAB — POCT URINALYSIS DIPSTICK
Protein, UA: NEGATIVE
Spec Grav, UA: 1.02
Urobilinogen, UA: NEGATIVE
pH, UA: 6

## 2013-01-17 MED ORDER — CIPROFLOXACIN HCL 500 MG PO TABS: 500.0000 mg | ORAL_TABLET | Freq: Two times a day (BID) | ORAL | Status: DC

## 2013-01-20 LAB — URINE CULTURE

## 2013-01-27 ENCOUNTER — Ambulatory Visit (INDEPENDENT_AMBULATORY_CARE_PROVIDER_SITE_OTHER): Payer: No Typology Code available for payment source | Admitting: Obstetrics & Gynecology

## 2013-01-27 ENCOUNTER — Encounter: Payer: Self-pay | Admitting: Obstetrics & Gynecology

## 2013-01-27 VITALS — BP 126/70 | HR 76 | Resp 16 | Wt 166.0 lb

## 2013-01-27 DIAGNOSIS — Z01419 Encounter for gynecological examination (general) (routine) without abnormal findings: Secondary | ICD-10-CM

## 2013-01-27 DIAGNOSIS — R350 Frequency of micturition: Secondary | ICD-10-CM

## 2013-01-27 DIAGNOSIS — Z124 Encounter for screening for malignant neoplasm of cervix: Secondary | ICD-10-CM

## 2013-01-27 DIAGNOSIS — Z0000 Encounter for general adult medical examination without abnormal findings: Secondary | ICD-10-CM

## 2013-01-27 DIAGNOSIS — Z1151 Encounter for screening for human papillomavirus (HPV): Secondary | ICD-10-CM

## 2013-01-27 LAB — POCT URINALYSIS DIPSTICK
Ketones, UA: NEGATIVE
Leukocytes, UA: NEGATIVE
Protein, UA: NEGATIVE
pH, UA: 6.5

## 2013-01-27 NOTE — Progress Notes (Signed)
Subjective:    [name] is a 37 y.o. female who presents for an annual exam. The patient has no complaints today except urinary frequency.  The patient is sexually active. GYN screening history: last pap: was normal. The patient wears seatbelts: yes. The patient participates in regular exercise: yes. Has the patient ever been transfused or tattooed?: yes (tattoos). The patient reports that there is not domestic violence in her life.   Menstrual History: OB History   Grav Para Term Preterm Abortions TAB SAB Ect Mult Living   5 4        4       Menarche age: 17  No LMP recorded. Patient is not currently having periods (Reason: IUD).    The following portions of the patient's history were reviewed and updated as appropriate: allergies, current medications, past family history, past medical history, past social history, past surgical history and problem list.  Review of Systems A comprehensive review of systems was negative. She has been monogamous for 11 years, some positional dyspareunia. She is Sales executive at [employer]. She had her flu shot last year.   Objective:    BP 126/70  Pulse 76  Resp 16  Wt 166 lb (75.297 kg)  BMI 28.48 kg/m2  General Appearance:    Alert, cooperative, no distress, appears stated age  Head:    Normocephalic, without obvious abnormality, atraumatic  Eyes:    PERRL, conjunctiva/corneas clear, EOM's intact, fundi    benign, both eyes  Ears:    Normal TM's and external ear canals, both ears  Nose:   Nares normal, septum midline, mucosa normal, no drainage    or sinus tenderness  Throat:   Lips, mucosa, and tongue normal; teeth and gums normal  Neck:   Supple, symmetrical, trachea midline, no adenopathy;    thyroid:  no enlargement/tenderness/nodules; no carotid   bruit or JVD  Back:     Symmetric, no curvature, ROM normal, no CVA tenderness  Lungs:     Clear to auscultation bilaterally, respirations unlabored  Chest Wall:    No  tenderness or deformity   Heart:    Regular rate and rhythm, S1 and S2 normal, no murmur, rub   or gallop  Breast Exam:    No tenderness, masses, or nipple abnormality  Abdomen:     Soft, non-tender, bowel sounds active all four quadrants,    no masses, no organomegaly  Genitalia:    Normal female without lesion, discharge or tenderness, NSSR, NT, mobile, normal adnexal exam     Extremities:   Extremities normal, atraumatic, no cyanosis or edema  Pulses:   2+ and symmetric all extremities  Skin:   Skin color, texture, turgor normal, no rashes or lesions  Lymph nodes:   Cervical, supraclavicular, and axillary nodes normal  Neurologic:   CNII-XII intact, normal strength, sensation and reflexes    throughout  .    Assessment:    Healthy female exam.  Urinary frequency   Plan:     Mammogram. Pap smear.  Urine culture

## 2013-01-31 ENCOUNTER — Encounter: Payer: Self-pay | Admitting: Nurse Practitioner

## 2013-01-31 ENCOUNTER — Ambulatory Visit (INDEPENDENT_AMBULATORY_CARE_PROVIDER_SITE_OTHER): Payer: No Typology Code available for payment source | Admitting: Nurse Practitioner

## 2013-01-31 VITALS — BP 136/85 | HR 97 | Ht 64.0 in | Wt 169.0 lb

## 2013-01-31 DIAGNOSIS — E663 Overweight: Secondary | ICD-10-CM | POA: Insufficient documentation

## 2013-01-31 DIAGNOSIS — G43909 Migraine, unspecified, not intractable, without status migrainosus: Secondary | ICD-10-CM

## 2013-01-31 MED ORDER — BUPROPION HCL 100 MG PO TABS: 100.0000 mg | ORAL_TABLET | ORAL | Status: DC

## 2013-01-31 NOTE — Patient Instructions (Signed)
Migraine Headache A migraine headache is an intense, throbbing pain on one or both sides of your head. A migraine can last for 30 minutes to several hours. CAUSES  The exact cause of a migraine headache is not always known. However, a migraine may be caused when nerves in the brain become irritated and release chemicals that cause inflammation. This causes pain. SYMPTOMS  Pain on one or both sides of your head.  Pulsating or throbbing pain.  Severe pain that prevents daily activities.  Pain that is aggravated by any physical activity.  Nausea, vomiting, or both.  Dizziness.  Pain with exposure to bright lights, loud noises, or activity.  General sensitivity to bright lights, loud noises, or smells. Before you get a migraine, you may get warning signs that a migraine is coming (aura). An aura may include:  Seeing flashing lights.  Seeing bright spots, halos, or zig-zag lines.  Having tunnel vision or blurred vision.  Having feelings of numbness or tingling.  Having trouble talking.  Having muscle weakness. MIGRAINE TRIGGERS  Alcohol.  Smoking.  Stress.  Menstruation.  Aged cheeses.  Foods or drinks that contain nitrates, glutamate, aspartame, or tyramine.  Lack of sleep.  Chocolate.  Caffeine.  Hunger.  Physical exertion.  Fatigue.  Medicines used to treat chest pain (nitroglycerine), birth control pills, estrogen, and some blood pressure medicines. DIAGNOSIS  A migraine headache is often diagnosed based on:  Symptoms.  Physical examination.  A CT scan or MRI of your head. TREATMENT Medicines may be given for pain and nausea. Medicines can also be given to help prevent recurrent migraines.  HOME CARE INSTRUCTIONS  Only take over-the-counter or prescription medicines for pain or discomfort as directed by your caregiver. The use of long-term narcotics is not recommended.  Lie down in a dark, quiet room when you have a migraine.  Keep a journal  to find out what may trigger your migraine headaches. For example, write down:  What you eat and drink.  How much sleep you get.  Any change to your diet or medicines.  Limit alcohol consumption.  Quit smoking if you smoke.  Get 7 to 9 hours of sleep, or as recommended by your caregiver.  Limit stress.  Keep lights dim if bright lights bother you and make your migraines worse. SEEK IMMEDIATE MEDICAL CARE IF:   Your migraine becomes severe.  You have a fever.  You have a stiff neck.  You have vision loss.  You have muscular weakness or loss of muscle control.  You start losing your balance or have trouble walking.  You feel faint or pass out.  You have severe symptoms that are different from your first symptoms. MAKE SURE YOU:   Understand these instructions.  Will watch your condition.  Will get help right away if you are not doing well or get worse. Document Released: 09/07/2005 Document Revised: 11/30/2011 Document Reviewed: 08/28/2011 Exit[employer] Patient Information 2013 [employer].  

## 2013-01-31 NOTE — Progress Notes (Signed)
S: Pt comes to office today for follow up on migraines. She is feeling more tired, stressed and overwhelmed. She feels that there are times she would like to just stay in bed. We discussed the issues of 2 teen aged daughters and 2 small children, a full time job and home to care for as being stressful events. She agrees this is at the base of all the anxiety. Overall her migraines are doing well with the Maxalt and she is usually making the # 7 tabs she gets last a month. She also recently fell down her steps and twisted her ankle which is adding to her anxiety.  O: General NAD Cardiac: RRR Lungs: Clear Skin: Warm and dry  A: Migraines Anxiety/ depression   P: She will drop the Zoloft dose to 100 mg and see if she is less sleepy and add Wellbutrin 100mg  in the morning. She will call the office in one month and let us know how this new plan is working. She is encouraged to spend some time alone.

## 2013-02-23 ENCOUNTER — Other Ambulatory Visit: Payer: Self-pay | Admitting: Nurse Practitioner

## 2013-03-07 ENCOUNTER — Ambulatory Visit (INDEPENDENT_AMBULATORY_CARE_PROVIDER_SITE_OTHER): Payer: No Typology Code available for payment source | Admitting: Nurse Practitioner

## 2013-03-07 ENCOUNTER — Encounter: Payer: Self-pay | Admitting: Nurse Practitioner

## 2013-03-07 VITALS — BP 117/68 | HR 78 | Ht 64.0 in | Wt 167.0 lb

## 2013-03-07 DIAGNOSIS — F419 Anxiety disorder, unspecified: Secondary | ICD-10-CM

## 2013-03-07 DIAGNOSIS — F411 Generalized anxiety disorder: Secondary | ICD-10-CM

## 2013-03-07 DIAGNOSIS — G43909 Migraine, unspecified, not intractable, without status migrainosus: Secondary | ICD-10-CM

## 2013-03-07 NOTE — Patient Instructions (Signed)
Migraine Headache A migraine headache is an intense, throbbing pain on one or both sides of your head. A migraine can last for 30 minutes to several hours. CAUSES  The exact cause of a migraine headache is not always known. However, a migraine may be caused when nerves in the brain become irritated and release chemicals that cause inflammation. This causes pain. SYMPTOMS  Pain on one or both sides of your head.  Pulsating or throbbing pain.  Severe pain that prevents daily activities.  Pain that is aggravated by any physical activity.  Nausea, vomiting, or both.  Dizziness.  Pain with exposure to bright lights, loud noises, or activity.  General sensitivity to bright lights, loud noises, or smells. Before you get a migraine, you may get warning signs that a migraine is coming (aura). An aura may include:  Seeing flashing lights.  Seeing bright spots, halos, or zig-zag lines.  Having tunnel vision or blurred vision.  Having feelings of numbness or tingling.  Having trouble talking.  Having muscle weakness. MIGRAINE TRIGGERS  Alcohol.  Smoking.  Stress.  Menstruation.  Aged cheeses.  Foods or drinks that contain nitrates, glutamate, aspartame, or tyramine.  Lack of sleep.  Chocolate.  Caffeine.  Hunger.  Physical exertion.  Fatigue.  Medicines used to treat chest pain (nitroglycerine), birth control pills, estrogen, and some blood pressure medicines. DIAGNOSIS  A migraine headache is often diagnosed based on:  Symptoms.  Physical examination.  A CT scan or MRI of your head. TREATMENT Medicines may be given for pain and nausea. Medicines can also be given to help prevent recurrent migraines.  HOME CARE INSTRUCTIONS  Only take over-the-counter or prescription medicines for pain or discomfort as directed by your caregiver. The use of long-term narcotics is not recommended.  Lie down in a dark, quiet room when you have a migraine.  Keep a journal  to find out what may trigger your migraine headaches. For example, write down:  What you eat and drink.  How much sleep you get.  Any change to your diet or medicines.  Limit alcohol consumption.  Quit smoking if you smoke.  Get 7 to 9 hours of sleep, or as recommended by your caregiver.  Limit stress.  Keep lights dim if bright lights bother you and make your migraines worse. SEEK IMMEDIATE MEDICAL CARE IF:   Your migraine becomes severe.  You have a fever.  You have a stiff neck.  You have vision loss.  You have muscular weakness or loss of muscle control.  You start losing your balance or have trouble walking.  You feel faint or pass out.  You have severe symptoms that are different from your first symptoms. MAKE SURE YOU:   Understand these instructions.  Will watch your condition.  Will get help right away if you are not doing well or get worse. Document Released: 09/07/2005 Document Revised: 11/30/2011 Document Reviewed: 08/28/2011 Exit[employer] Patient Information 2014 [employer].  

## 2013-03-07 NOTE — Progress Notes (Signed)
Here today for headache follow-up.  

## 2013-03-07 NOTE — Progress Notes (Signed)
S: Pt is here for follow up on migraines and mood. She feels her migraines are better and only used 4 of her Maxalt this month. Her mood is also improved and she is feeling less stressed and has more energy with decrease dose in zoloft and addition of wellbutrin. She prefers to leave things alone at this time. Her 2 older daughters will be leaving at the end of this month for visit with their father. Her ankle is improved.  O: alert, oriented, NAD Cardiac: RRR Lungs Clear  A: Migraine Anxiety  P: Will leave meds at same doses and make no changes at this time. She will call office if any thing changes. RTC 6 months

## 2013-07-26 ENCOUNTER — Telehealth: Payer: Self-pay | Admitting: *Deleted

## 2013-07-26 DIAGNOSIS — G43909 Migraine, unspecified, not intractable, without status migrainosus: Secondary | ICD-10-CM

## 2013-07-26 MED ORDER — RIZATRIPTAN BENZOATE 10 MG PO TABS: 10.0000 mg | ORAL_TABLET | Freq: Once | ORAL | Status: DC | PRN

## 2013-07-26 NOTE — Telephone Encounter (Signed)
Patients pharmacy sent a request for a refill of her maxalt.  Refill authorized.

## 2013-08-24 ENCOUNTER — Telehealth: Payer: Self-pay | Admitting: *Deleted

## 2013-08-24 DIAGNOSIS — F419 Anxiety disorder, unspecified: Secondary | ICD-10-CM

## 2013-08-24 MED ORDER — SERTRALINE HCL 50 MG PO TABS: 50.0000 mg | ORAL_TABLET | Freq: Three times a day (TID) | ORAL | Status: DC

## 2013-08-24 NOTE — Telephone Encounter (Signed)
Pharmacy faxed request for Zoloft refill.  Patient is current, refills authorized.

## 2013-09-29 ENCOUNTER — Telehealth: Payer: Self-pay | Admitting: *Deleted

## 2013-09-29 NOTE — Telephone Encounter (Signed)
Patient is requesting a refill of her bupropion 100mg .

## 2013-10-03 ENCOUNTER — Telehealth: Payer: Self-pay | Admitting: *Deleted

## 2013-10-03 MED ORDER — BUPROPION HCL 100 MG PO TABS: 100.0000 mg | ORAL_TABLET | ORAL | Status: DC

## 2013-10-03 NOTE — Telephone Encounter (Signed)
Pharmacy called to refill patients Buproprion.

## 2013-12-08 ENCOUNTER — Telehealth: Payer: Self-pay | Admitting: *Deleted

## 2013-12-08 DIAGNOSIS — N390 Urinary tract infection, site not specified: Secondary | ICD-10-CM

## 2013-12-08 MED ORDER — CIPROFLOXACIN HCL 500 MG PO TABS: 500.0000 mg | ORAL_TABLET | Freq: Two times a day (BID) | ORAL | Status: DC

## 2013-12-08 NOTE — Telephone Encounter (Signed)
Patient is having UTI pain and burning with urination and is unable to come for appointment this morning.  We will call in medication for her and she will follow up for an appointment if her symptoms worsen or change.

## 2013-12-19 ENCOUNTER — Encounter: Payer: Self-pay | Admitting: Nurse Practitioner

## 2013-12-19 ENCOUNTER — Ambulatory Visit (INDEPENDENT_AMBULATORY_CARE_PROVIDER_SITE_OTHER): Payer: No Typology Code available for payment source | Admitting: Nurse Practitioner

## 2013-12-19 VITALS — BP 118/84 | HR 84 | Ht 64.0 in | Wt 183.0 lb

## 2013-12-19 DIAGNOSIS — G43909 Migraine, unspecified, not intractable, without status migrainosus: Secondary | ICD-10-CM

## 2013-12-19 DIAGNOSIS — F411 Generalized anxiety disorder: Secondary | ICD-10-CM

## 2013-12-19 DIAGNOSIS — E669 Obesity, unspecified: Secondary | ICD-10-CM | POA: Insufficient documentation

## 2013-12-19 DIAGNOSIS — F419 Anxiety disorder, unspecified: Secondary | ICD-10-CM

## 2013-12-19 MED ORDER — ALPRAZOLAM 0.25 MG PO TABS: 0.2500 mg | ORAL_TABLET | Freq: Every evening | ORAL | Status: DC | PRN

## 2013-12-19 NOTE — Patient Instructions (Signed)
Migraine Headache A migraine headache is an intense, throbbing pain on one or both sides of your head. A migraine can last for 30 minutes to several hours. CAUSES  The exact cause of a migraine headache is not always known. However, a migraine may be caused when nerves in the brain become irritated and release chemicals that cause inflammation. This causes pain. Certain things may also trigger migraines, such as:  Alcohol.  Smoking.  Stress.  Menstruation.  Aged cheeses.  Foods or drinks that contain nitrates, glutamate, aspartame, or tyramine.  Lack of sleep.  Chocolate.  Caffeine.  Hunger.  Physical exertion.  Fatigue.  Medicines used to treat chest pain (nitroglycerine), birth control pills, estrogen, and some blood pressure medicines. SIGNS AND SYMPTOMS  Pain on one or both sides of your head.  Pulsating or throbbing pain.  Severe pain that prevents daily activities.  Pain that is aggravated by any physical activity.  Nausea, vomiting, or both.  Dizziness.  Pain with exposure to bright lights, loud noises, or activity.  General sensitivity to bright lights, loud noises, or smells. Before you get a migraine, you may get warning signs that a migraine is coming (aura). An aura may include:  Seeing flashing lights.  Seeing bright spots, halos, or zig-zag lines.  Having tunnel vision or blurred vision.  Having feelings of numbness or tingling.  Having trouble talking.  Having muscle weakness. DIAGNOSIS  A migraine headache is often diagnosed based on:  Symptoms.  Physical exam.  A CT scan or MRI of your head. These imaging tests cannot diagnose migraines, but they can help rule out other causes of headaches. TREATMENT Medicines may be given for pain and nausea. Medicines can also be given to help prevent recurrent migraines.  HOME CARE INSTRUCTIONS  Only take over-the-counter or prescription medicines for pain or discomfort as directed by your  health care provider. The use of long-term narcotics is not recommended.  Lie down in a dark, quiet room when you have a migraine.  Keep a journal to find out what may trigger your migraine headaches. For example, write down:  What you eat and drink.  How much sleep you get.  Any change to your diet or medicines.  Limit alcohol consumption.  Quit smoking if you smoke.  Get 7 9 hours of sleep, or as recommended by your health care provider.  Limit stress.  Keep lights dim if bright lights bother you and make your migraines worse. SEEK IMMEDIATE MEDICAL CARE IF:   Your migraine becomes severe.  You have a fever.  You have a stiff neck.  You have vision loss.  You have muscular weakness or loss of muscle control.  You start losing your balance or have trouble walking.  You feel faint or pass out.  You have severe symptoms that are different from your first symptoms. MAKE SURE YOU:   Understand these instructions.  Will watch your condition.  Will get help right away if you are not doing well or get worse. Document Released: 09/07/2005 Document Revised: 06/28/2013 Document Reviewed: 05/15/2013 Exit[employer] Patient Information 2014 [employer].  

## 2013-12-19 NOTE — Progress Notes (Signed)
History:  [name] is a 38 y.o. G5P4 who presents to [facility] clinic today for follow up on migraines. She was last seen one year ago and does very well from a headache standpoint with Maxalt. She is on Zoloft and wellbutrin for anxiety. Lately she has been quite anxious over family issues that include grandparents dying and being part of estate planning. She was diagnosed with Plantar Fasciitis and has been on Celebrex and flexeril. She will be starting physical therapy soon and weight watchers program.    The following portions of the patient's history were reviewed and updated as appropriate: allergies, current medications, past family history, past medical history, past social history, past surgical history and problem list.  Review of Systems:  Pertinent items are noted in HPI.  Objective:  Physical Exam BP 118/84  Pulse 84  Ht 5\' 4"  (1.626 m)  Wt 183 lb (83.008 kg)  BMI 31.40 kg/m2 GENERAL: Well-developed, well-nourished female in no acute distress.  HEENT: Normocephalic, atraumatic.  NECK: Supple. Normal thyroid.  LUNGS: Normal rate. Clear to auscultation bilaterally.  HEART: Regular rate and rhythm with no adventitious sounds. nd rugated.  Normal discharge. Normal cervix contour. Pap smear obtained. Uterus is normal in size. No adnexal mass or tenderness.  EXTREMITIES: No cyanosis, clubbing, or edema, 2+ distal pulses.   Labs and Imaging No results found.  Assessment & Plan:  Assessment:  Migraine without aura Anxiety  Plans:  Refill Xanax and reviewed other medications to include wellbutrin, Zoloft, celebrex and Flexeril Encouraged weight loss, and exercise She has return appointment in several weeks for pap  Olegario Messier, NP 12/19/2013 4:34 PM

## 2013-12-22 ENCOUNTER — Ambulatory Visit: Payer: No Typology Code available for payment source | Admitting: Cardiovascular Disease

## 2013-12-29 ENCOUNTER — Ambulatory Visit (INDEPENDENT_AMBULATORY_CARE_PROVIDER_SITE_OTHER): Payer: No Typology Code available for payment source | Admitting: Obstetrics & Gynecology

## 2013-12-29 ENCOUNTER — Encounter: Payer: Self-pay | Admitting: Obstetrics & Gynecology

## 2013-12-29 VITALS — BP 117/74 | HR 96 | Ht 64.0 in | Wt 183.0 lb

## 2013-12-29 DIAGNOSIS — N898 Other specified noninflammatory disorders of vagina: Secondary | ICD-10-CM

## 2013-12-29 DIAGNOSIS — Z1151 Encounter for screening for human papillomavirus (HPV): Secondary | ICD-10-CM

## 2013-12-29 DIAGNOSIS — Z124 Encounter for screening for malignant neoplasm of cervix: Secondary | ICD-10-CM

## 2013-12-29 DIAGNOSIS — N390 Urinary tract infection, site not specified: Secondary | ICD-10-CM

## 2013-12-29 DIAGNOSIS — Z01419 Encounter for gynecological examination (general) (routine) without abnormal findings: Secondary | ICD-10-CM

## 2013-12-29 DIAGNOSIS — N949 Unspecified condition associated with female genital organs and menstrual cycle: Secondary | ICD-10-CM

## 2013-12-29 LAB — POCT URINALYSIS DIPSTICK
Bilirubin, UA: NEGATIVE
Blood, UA: NEGATIVE
GLUCOSE UA: NEGATIVE
Ketones, UA: NEGATIVE
NITRITE UA: POSITIVE
Protein, UA: NEGATIVE
Spec Grav, UA: 1.01
Urobilinogen, UA: NEGATIVE
pH, UA: 7

## 2013-12-29 MED ORDER — CIPROFLOXACIN HCL 500 MG PO TABS: 500.0000 mg | ORAL_TABLET | Freq: Two times a day (BID) | ORAL | Status: DC

## 2013-12-29 NOTE — Progress Notes (Signed)
Patient is here today for her yearly exam.  She is concerned about vaginal odor and feels like her urine infection is not completely gone.  She did finish the antibiotics that we had given her previously.  Her urine does show positive for nitrates and leukocytes.

## 2013-12-29 NOTE — Patient Instructions (Signed)
Urinary Tract Infection  Urinary tract infections (UTIs) can develop anywhere along your urinary tract. Your urinary tract is your body's drainage system for removing wastes and extra water. Your urinary tract includes two kidneys, two ureters, a bladder, and a urethra. Your kidneys are a pair of bean-shaped organs. Each kidney is about the size of your fist. They are located below your ribs, one on each side of your spine.  CAUSES  Infections are caused by microbes, which are microscopic organisms, including fungi, viruses, and bacteria. These organisms are so small that they can only be seen through a microscope. Bacteria are the microbes that most commonly cause UTIs.  SYMPTOMS   Symptoms of UTIs may vary by age and gender of the patient and by the location of the infection. Symptoms in young women typically include a frequent and intense urge to urinate and a painful, burning feeling in the bladder or urethra during urination. Older women and men are more likely to be tired, shaky, and weak and have muscle aches and abdominal pain. A fever may mean the infection is in your kidneys. Other symptoms of a kidney infection include pain in your back or sides below the ribs, nausea, and vomiting.  DIAGNOSIS  To diagnose a UTI, your caregiver will ask you about your symptoms. Your caregiver also will ask to provide a urine sample. The urine sample will be tested for bacteria and white blood cells. White blood cells are made by your body to help fight infection.  TREATMENT   Typically, UTIs can be treated with medication. Because most UTIs are caused by a bacterial infection, they usually can be treated with the use of antibiotics. The choice of antibiotic and length of treatment depend on your symptoms and the type of bacteria causing your infection.  HOME CARE INSTRUCTIONS   If you were prescribed antibiotics, take them exactly as your caregiver instructs you. Finish the medication even if you feel better after you  have only taken some of the medication.   Drink enough water and fluids to keep your urine clear or pale yellow.   Avoid caffeine, tea, and carbonated beverages. They tend to irritate your bladder.   Empty your bladder often. Avoid holding urine for long periods of time.   Empty your bladder before and after sexual intercourse.   After a bowel movement, women should cleanse from front to back. Use each tissue only once.  SEEK MEDICAL CARE IF:    You have back pain.   You develop a fever.   Your symptoms do not begin to resolve within 3 days.  SEEK IMMEDIATE MEDICAL CARE IF:    You have severe back pain or lower abdominal pain.   You develop chills.   You have nausea or vomiting.   You have continued burning or discomfort with urination.  MAKE SURE YOU:    Understand these instructions.   Will watch your condition.   Will get help right away if you are not doing well or get worse.  Document Released: 06/17/2005 Document Revised: 03/08/2012 Document Reviewed: 10/16/2011  [employer] Patient Information 2014 [employer].

## 2013-12-29 NOTE — Progress Notes (Signed)
Patient ID: [name], female   DOB: October 11, 1975, 38 y.o.   MRN: [id number] Subjective:     [name] is a 38 y.o. female here for a routine exam.  Current complaints: vaginal odor at the end of the day.  No abnormal discharge.  Had UTI 1 week prev.  Completed 3 days of atbx but, feels like she still has sx.   Gynecologic History No LMP recorded. Patient is not currently having periods (Reason: IUD). Contraception: IUD Last Pap: neg with neg HRHPV.  Last mammogram: n/a.  Obstetric History OB History  Gravida Para Term Preterm AB SAB TAB Ectopic Multiple Living  5 4        4     # Outcome Date GA Lbr Len/2nd Weight Sex Delivery Anes PTL Lv  5 GRA           4 PAR           3 PAR           2 PAR           1 PAR                The following portions of the patient's history were reviewed and updated as appropriate: allergies, current medications, past family history, past medical history, past social history, past surgical history and problem list.  Review of Systems Pertinent items are noted in HPI.    Objective:    BP 117/74  Pulse 96  Ht 5\' 4"  (1.626 m)  Wt 183 lb (83.008 kg)  BMI 31.40 kg/m2  General Appearance:    Alert, cooperative, no distress, appears stated age  Head:    Normocephalic, without obvious abnormality, atraumatic              Neck:   Supple, symmetrical, trachea midline, no adenopathy;    thyroid:  no enlargement/tenderness/nodules; no carotid   bruit or JVD  Back:     Symmetric, no curvature, ROM normal, no CVA tenderness  Lungs:     Clear to auscultation bilaterally, respirations unlabored  Chest Wall:    No tenderness or deformity   Heart:    Regular rate and rhythm, S1 and S2 normal, no murmur, rub   or gallop  Breast Exam:    No tenderness, masses, or nipple abnormality  Abdomen:     Soft, non-tender, bowel sounds active all four quadrants,    no masses, no organomegaly  Genitalia:    Normal female without lesion, discharge or  tenderness  Rectal:    Not done  Extremities:   Extremities normal, atraumatic, no cyanosis or edema  Pulses:   2+ and symmetric all extremities  Skin:   Skin color, texture, turgor normal, no rashes or lesions           Urine dipstick shows positive for nitrates and positive for leukocytes.  Micro exam: not done.   Assessment:    Healthy female exam.  UTI Possible BV- not treated today    Plan:    Follow up in: 1 year.   Cipro 500mg  bid x 7 days Urine cx PAP with wel mount- if PAP and HR HPV neg, no PAP needed for 3 years.

## 2014-01-01 LAB — WET PREP, GENITAL
Clue Cells Wet Prep HPF POC: NONE SEEN
Trich, Wet Prep: NONE SEEN
Yeast Wet Prep HPF POC: NONE SEEN

## 2014-01-01 LAB — URINE CULTURE

## 2014-01-05 ENCOUNTER — Ambulatory Visit (INDEPENDENT_AMBULATORY_CARE_PROVIDER_SITE_OTHER): Payer: No Typology Code available for payment source | Admitting: Cardiovascular Disease

## 2014-01-05 ENCOUNTER — Encounter: Payer: Self-pay | Admitting: Cardiovascular Disease

## 2014-01-05 VITALS — BP 120/86 | HR 92 | Ht 64.0 in | Wt 183.8 lb

## 2014-01-05 DIAGNOSIS — Z87891 Personal history of nicotine dependence: Secondary | ICD-10-CM

## 2014-01-05 DIAGNOSIS — I4719 Other supraventricular tachycardia: Secondary | ICD-10-CM | POA: Insufficient documentation

## 2014-01-05 DIAGNOSIS — F419 Anxiety disorder, unspecified: Secondary | ICD-10-CM

## 2014-01-05 DIAGNOSIS — F411 Generalized anxiety disorder: Secondary | ICD-10-CM

## 2014-01-05 DIAGNOSIS — R000 Tachycardia, unspecified: Secondary | ICD-10-CM

## 2014-01-05 DIAGNOSIS — E669 Obesity, unspecified: Secondary | ICD-10-CM

## 2014-01-05 DIAGNOSIS — I471 Supraventricular tachycardia: Secondary | ICD-10-CM

## 2014-01-05 DIAGNOSIS — R002 Palpitations: Secondary | ICD-10-CM

## 2014-01-05 MED ORDER — PROPRANOLOL HCL 20 MG PO TABS: 20.0000 mg | ORAL_TABLET | Freq: Three times a day (TID) | ORAL | Status: DC | PRN

## 2014-01-05 MED ORDER — METOPROLOL TARTRATE 25 MG PO TABS: 25.0000 mg | ORAL_TABLET | Freq: Two times a day (BID) | ORAL | Status: DC | PRN

## 2014-01-05 NOTE — Assessment & Plan Note (Signed)
We have encouraged her to continue to work on weaning her cigarettes and smoking cessation. She will continue to work on this and does not want any assistance with chantix.  

## 2014-01-05 NOTE — Assessment & Plan Note (Signed)
We'll try low-dose beta blockers for symptom relief. No other workup needed at this time. Holter or event monitor offered.

## 2014-01-05 NOTE — Assessment & Plan Note (Signed)
She reports that she takes Xanax periodically for symptoms. This seems to also help her tachycardia

## 2014-01-05 NOTE — Progress Notes (Signed)
Patient ID: [name], female    DOB: Aug 18, 1976, 38 y.o.   MRN: [id number]  HPI Comments: [name] is a pleasant 38 year old woman with long history of arrhythmia dating back several years initially with bradycardia during the pregnancy of her first son and tachycardia with her second son during delivery and in recovery, history of acute onset of tachycardia with rates up to 120s from rest, presents to establish care in the [place] office.  She is a smoker. 4 children  She reports that every few day she has an episode of tachycardia. Rate will go up to at least 120 beats per minute lasting for 30 minutes or more. She typically does not think, weights for symptoms to resolve. Sometimes takes Xanax which seems to help her symptoms. Denies having symptoms with exertion. Possibly exacerbated by stress. She works in a Social worker. She has a history of hip and back problems. Continues to smoke  When she has tachycardia, she reports acute onset, acute cessation of the rhythm at termination. Typically coming on at rest She measures her heart rate by manual palpation Denies any significant chest pain with exertion. No lightheadedness or near syncope  Prior echocardiogram in 2012: Essentially normal study Left ventricle: The cavity size was normal. Wall thickness was normal. Systolic function was normal. The estimated ejection fraction was in the range of 60% to 65%. Wall motion was normal; there were no regional wall motion abnormalities. Left ventricular diastolic function parameters were normal.  . EKG shows normal sinus rhythm with rate 92 beats per minute, no significant ST or T wave changes   Outpatient Encounter Prescriptions as of 01/05/2014  Medication Sig  . ALPRAZolam (XANAX) 0.25 MG tablet Take 1 tablet (0.25 mg total) by mouth at bedtime as needed for sleep.  Marland Kitchen buPROPion (WELLBUTRIN) 100 MG tablet Take 1 tablet (100 mg total) by mouth 1 day or 1 dose. Take  dose in morning  . celecoxib (CELEBREX) 200 MG capsule Take 200 mg by mouth 2 (two) times daily.  . ciprofloxacin (CIPRO) 500 MG tablet Take 1 tablet (500 mg total) by mouth 2 (two) times daily.  . cyclobenzaprine (FLEXERIL) 5 MG tablet Take 5 mg by mouth 3 (three) times daily as needed for muscle spasms.  Marland Kitchen ibuprofen (ADVIL,MOTRIN) 200 MG tablet Take 200 mg by mouth as needed.    . loratadine-pseudoephedrine (CLARITIN-D 24-HOUR) 10-240 MG per 24 hr tablet Take 1 tablet by mouth daily.  . Multiple Vitamins-Minerals (MULTIVITAMIN WITH MINERALS) tablet Take 1 tablet by mouth daily.    . rizatriptan (MAXALT) 10 MG tablet Take 1 tablet (10 mg total) by mouth once as needed for migraine. May repeat in 2 hours if needed  . sertraline (ZOLOFT) 50 MG tablet Take 1 tablet (50 mg total) by mouth 3 (three) times daily.  Marland Kitchen zolpidem (AMBIEN) 10 MG tablet Take 1 tablet (10 mg total) by mouth at bedtime as needed for sleep.  . metoprolol tartrate (LOPRESSOR) 25 MG tablet Take 1 tablet (25 mg total) by mouth 2 (two) times daily as needed (tachycardia).  . propranolol (INDERAL) 20 MG tablet Take 1 tablet (20 mg total) by mouth 3 (three) times daily as needed (tachycardia).     Review of Systems  Constitutional: Negative.   HENT: Negative.   Eyes: Negative.   Respiratory: Negative.   Cardiovascular: Positive for palpitations.       Tachycardia  Gastrointestinal: Negative.   Endocrine: Negative.   Musculoskeletal: Negative.   Skin:  Negative.   Allergic/Immunologic: Negative.   Neurological: Negative.   Hematological: Negative.   Psychiatric/Behavioral: Negative.   All other systems reviewed and are negative.   BP 120/86  Pulse 92  Ht 5\' 4"  (1.626 m)  Wt 183 lb 12 oz (83.348 kg)  BMI 31.52 kg/m2  Physical Exam  Nursing note and vitals reviewed. Constitutional: She is oriented to person, place, and time. She appears well-developed and well-nourished.  HENT:  Head: Normocephalic.  Nose: Nose  normal.  Mouth/Throat: Oropharynx is clear and moist.  Eyes: Conjunctivae are normal. Pupils are equal, round, and reactive to light.  Neck: Normal range of motion. Neck supple. No JVD present.  Cardiovascular: Normal rate, regular rhythm, S1 normal, S2 normal, normal heart sounds and intact distal pulses.  Exam reveals no gallop and no friction rub.   No murmur heard. Pulmonary/Chest: Effort normal and breath sounds normal. No respiratory distress. She has no wheezes. She has no rales. She exhibits no tenderness.  Abdominal: Soft. Bowel sounds are normal. She exhibits no distension. There is no tenderness.  Musculoskeletal: Normal range of motion. She exhibits no edema and no tenderness.  Lymphadenopathy:    She has no cervical adenopathy.  Neurological: She is alert and oriented to person, place, and time. Coordination normal.  Skin: Skin is warm and dry. No rash noted. No erythema.  Psychiatric: She has a normal mood and affect. Her behavior is normal. Judgment and thought content normal.    Assessment and Plan

## 2014-01-05 NOTE — Assessment & Plan Note (Signed)
We have encouraged continued exercise, careful diet management in an effort to lose weight. 

## 2014-01-05 NOTE — Patient Instructions (Addendum)
You are doing well. Please try propranolol 1/2 to 1 pill as needed for tachycardia  Ok to try metoprolol in the AM for full day coverage 1/2 to 1 pill daily Ok to take with xanax  Please call us if you have new issues that need to be addressed before your next appt.  Follow up in a year

## 2014-01-05 NOTE — Addendum Note (Signed)
Addended by: Minna Merritts on: 01/05/2014 06:08 PM   Modules accepted: Level of Service

## 2014-01-05 NOTE — Assessment & Plan Note (Addendum)
Symptoms concerning for atrial tachycardia with acute onset and cessation. We had a long discussion about other rhythms/arrhythmia.We have recommended that she try propranolol as needed for symptoms. If she would like, she could try low-dose metoprolol 12.5 mg in the morning as most of her symptoms are in the day, not at night. The metoprolol could be titrated up to 25 mg in the a.m. As tolerated. We did discuss other forms of monitoring such as a Holter or 30 day monitor. She would like to try the medications at this time. She can also use her phone to monitor her pulse.

## 2014-02-23 ENCOUNTER — Telehealth: Payer: Self-pay

## 2014-02-23 NOTE — Telephone Encounter (Signed)
Received letter from [facility] asking if pt has any medial contraindications to nitrous oxide & O2 or 2% lidocain w/ 1:100,000 parts epinephrine local anesthesia for restoration of 2 permanent teeth.  Faxed recommendation w/ Dr. Donivan Scull signature that he does not note any med contraindications to [phone].

## 2014-05-18 ENCOUNTER — Ambulatory Visit (INDEPENDENT_AMBULATORY_CARE_PROVIDER_SITE_OTHER): Payer: No Typology Code available for payment source | Admitting: Family Medicine

## 2014-05-18 ENCOUNTER — Encounter: Payer: Self-pay | Admitting: Family Medicine

## 2014-05-18 VITALS — BP 115/76 | HR 89 | Ht 64.0 in | Wt 185.4 lb

## 2014-05-18 DIAGNOSIS — N898 Other specified noninflammatory disorders of vagina: Secondary | ICD-10-CM

## 2014-05-18 DIAGNOSIS — Z30432 Encounter for removal of intrauterine contraceptive device: Secondary | ICD-10-CM

## 2014-05-18 NOTE — Progress Notes (Signed)
Here today for IUD removal.

## 2014-05-18 NOTE — Progress Notes (Signed)
    Subjective:    Patient ID: [name] is a 38 y.o. female presenting with Contraception  on 05/18/2014  HPI: Here for IUD removal.  Husband had vasectomy 1.5 years ago.  Reports weight gain and vaginal dryness possibly related to IUD.  No cycles right now.  Review of Systems  Constitutional: Negative for fever.  Respiratory: Negative for shortness of breath.   Cardiovascular: Negative for leg swelling.  Gastrointestinal: Negative for abdominal pain.  Skin: Negative for rash.      Objective:    BP 115/76  Pulse 89  Ht 5\' 4"  (1.626 m)  Wt 185 lb 6 oz (84.086 kg)  BMI 31.80 kg/m2 Physical Exam  Vitals reviewed. Constitutional: She appears well-developed and well-nourished. No distress.  HENT:  Head: Normocephalic and atraumatic.  Cardiovascular: Normal rate.   Pulmonary/Chest: Effort normal.  Abdominal: Soft.  Genitourinary: Vagina normal.  Neurological: She is alert.    Procedure: Speculum placed inside vagina.  Cervix visualized.  Strings grasped with ring forceps.  IUD removed intact.      Assessment & Plan:   Encounter for IUD removal  Vaginal dryness Trial of Astroglide if IUD removal is not enough to lessen symptoms.  Return if symptoms worsen or fail to improve.

## 2014-07-23 ENCOUNTER — Encounter: Payer: Self-pay | Admitting: Family Medicine

## 2014-08-02 ENCOUNTER — Telehealth: Payer: Self-pay

## 2014-08-02 NOTE — Telephone Encounter (Signed)
Patient has symptoms of a UTI has had them before and knows it's a bladder infection. We called in [place] to her pharmacy [id number] no refills. Patient adviced to RTO if doesn't;t get better after antibiotic for a f/u.

## 2014-10-18 ENCOUNTER — Other Ambulatory Visit: Payer: Self-pay | Admitting: *Deleted

## 2014-10-18 DIAGNOSIS — F419 Anxiety disorder, unspecified: Secondary | ICD-10-CM

## 2014-10-18 MED ORDER — SERTRALINE HCL 50 MG PO TABS
50.0000 mg | ORAL_TABLET | Freq: Three times a day (TID) | ORAL | Status: DC
Start: 2014-10-18 — End: 2015-03-01

## 2014-10-18 MED ORDER — BUPROPION HCL 100 MG PO TABS: 100.0000 mg | ORAL_TABLET | ORAL | Status: DC

## 2014-10-18 NOTE — Telephone Encounter (Signed)
Refilled patients medications, she is due back to be seen in March.

## 2015-01-30 ENCOUNTER — Ambulatory Visit (INDEPENDENT_AMBULATORY_CARE_PROVIDER_SITE_OTHER): Payer: No Typology Code available for payment source | Admitting: Physician Assistant

## 2015-01-30 ENCOUNTER — Encounter: Payer: Self-pay | Admitting: Physician Assistant

## 2015-01-30 VITALS — BP 139/85 | HR 101 | Ht 64.0 in | Wt 189.6 lb

## 2015-01-30 DIAGNOSIS — F419 Anxiety disorder, unspecified: Secondary | ICD-10-CM

## 2015-01-30 MED ORDER — ALPRAZOLAM 0.25 MG PO TABS: 0.2500 mg | ORAL_TABLET | Freq: Every evening | ORAL | Status: DC | PRN

## 2015-01-30 MED ORDER — BUPROPION HCL ER (XL) 150 MG PO TB24: 150.0000 mg | ORAL_TABLET | Freq: Every day | ORAL | Status: DC

## 2015-01-30 NOTE — Patient Instructions (Signed)
Generalized Anxiety Disorder Generalized anxiety disorder (GAD) is a mental disorder. It interferes with life functions, including relationships, work, and school. GAD is different from normal anxiety, which everyone experiences at some point in their lives in response to specific life events and activities. Normal anxiety actually helps us prepare for and get through these life events and activities. Normal anxiety goes away after the event or activity is over.  GAD causes anxiety that is not necessarily related to specific events or activities. It also causes excess anxiety in proportion to specific events or activities. The anxiety associated with GAD is also difficult to control. GAD can vary from mild to severe. People with severe GAD can have intense waves of anxiety with physical symptoms (panic attacks).  SYMPTOMS The anxiety and worry associated with GAD are difficult to control. This anxiety and worry are related to many life events and activities and also occur more days than not for 6 months or longer. People with GAD also have three or more of the following symptoms (one or more in children):  Restlessness.   Fatigue.  Difficulty concentrating.   Irritability.  Muscle tension.  Difficulty sleeping or unsatisfying sleep. DIAGNOSIS GAD is diagnosed through an assessment by your health care provider. Your health care provider will ask you questions aboutyour mood,physical symptoms, and events in your life. Your health care provider may ask you about your medical history and use of alcohol or drugs, including prescription medicines. Your health care provider may also do a physical exam and blood tests. Certain medical conditions and the use of certain substances can cause symptoms similar to those associated with GAD. Your health care provider may refer you to a mental health specialist for further evaluation. TREATMENT The following therapies are usually used to treat GAD:    Medication. Antidepressant medication usually is prescribed for long-term daily control. Antianxiety medicines may be added in severe cases, especially when panic attacks occur.   Talk therapy (psychotherapy). Certain types of talk therapy can be helpful in treating GAD by providing support, education, and guidance. A form of talk therapy called cognitive behavioral therapy can teach you healthy ways to think about and react to daily life events and activities.  Stress managementtechniques. These include yoga, meditation, and exercise and can be very helpful when they are practiced regularly. A mental health specialist can help determine which treatment is best for you. Some people see improvement with one therapy. However, other people require a combination of therapies. Document Released: 01/02/2013 Document Revised: 01/22/2014 Document Reviewed: 01/02/2013 Exit[employer] Patient Information 2015 [employer]. This information is not intended to replace advice given to you by your health care provider. Make sure you discuss any questions you have with your health care provider.  

## 2015-01-30 NOTE — Progress Notes (Signed)
Patient ID: [name], female   DOB: 04-11-1976, 39 y.o.   MRN: [id number] History:  [name] is a 39 y.o. G5P4 who presents to clinic today for discussion of medication.  Her insurance stopped covering zoloft written for 50mg  tid a little over a month ago.  She has noted that she is yelling at her kids all the time, constantly feels worried and worked up, cannot relax.   She hurt her foot over a year ago and has had difficulty with healing.  She is presently in a boot and seeing some improvement.  She thinks this is related to overall just feeling bad/not exercising or getting out.  She expects boot x a few weeks longer.   Symptoms worsened 3.5 weeks ago.   She denies nausea, vomiting, fever, weakness, suicidal/homicidal ideation.   She requests to increase Wellbutrin rather than add zoloft back in.  She desires improved libido and weight loss.  She smokes daily.   The following portions of the patient's history were reviewed and updated as appropriate: allergies, current medications, past family history, past medical history, past social history, past surgical history and problem list.  Review of Systems:  Pertinent ROS in HPI.  All other systems are negative.    Objective:  Physical Exam BP 139/85 mmHg  Pulse 101  Ht 5\' 4"  (1.626 m)  Wt 189 lb 9.6 oz (86.002 kg)  BMI 32.53 kg/m2 GENERAL: Well-developed, well-nourished female in no acute distress.  HEENT: Normocephalic, atraumatic.  LUNGS: Normal rate.  HEART: Regular rate and no respiratory distress. Labs and Imaging No results found.  Assessment & Plan:  Assessment: 1. Anxiety      Plans: Increase Wellbutrin to 150mg  XL Work toward smoking cessation/increasing exercise/good self care. Return in 1 month for re-eval and annual exam.  Paticia Stack, PA-C 01/30/2015 4:18 PM

## 2015-01-30 NOTE — Progress Notes (Signed)
Patient is here to discuss medication, she is interested in increasing her Wellbutrin dose and needs to restart her Zoloft.  The pharmacy quit covering it at three tablets a day but she was taking them all in one dose so she would like a stronger tablet called in so it would just be one pill a day.  She would also like a refill of xanax if at all possible.  Her last fill of this was march 2015.

## 2015-03-01 ENCOUNTER — Ambulatory Visit (INDEPENDENT_AMBULATORY_CARE_PROVIDER_SITE_OTHER): Payer: No Typology Code available for payment source | Admitting: Obstetrics & Gynecology

## 2015-03-01 ENCOUNTER — Encounter: Payer: Self-pay | Admitting: Obstetrics & Gynecology

## 2015-03-01 VITALS — BP 114/73 | HR 92 | Ht 64.0 in | Wt 182.0 lb

## 2015-03-01 DIAGNOSIS — Z124 Encounter for screening for malignant neoplasm of cervix: Secondary | ICD-10-CM

## 2015-03-01 DIAGNOSIS — Z01419 Encounter for gynecological examination (general) (routine) without abnormal findings: Secondary | ICD-10-CM | POA: Diagnosis not present

## 2015-03-01 DIAGNOSIS — Z1151 Encounter for screening for human papillomavirus (HPV): Secondary | ICD-10-CM | POA: Diagnosis not present

## 2015-03-01 DIAGNOSIS — Z113 Encounter for screening for infections with a predominantly sexual mode of transmission: Secondary | ICD-10-CM | POA: Diagnosis not present

## 2015-03-01 DIAGNOSIS — Z0000 Encounter for general adult medical examination without abnormal findings: Secondary | ICD-10-CM

## 2015-03-01 LAB — CBC
HCT: 41.4 % (ref 36.0–46.0)
Hemoglobin: 13.8 g/dL (ref 12.0–15.0)
MCH: 32.4 pg (ref 26.0–34.0)
MCHC: 33.3 g/dL (ref 30.0–36.0)
MCV: 97.2 fL (ref 78.0–100.0)
MPV: 11.7 fL (ref 8.6–12.4)
Platelets: 232 10*3/uL (ref 150–400)
RBC: 4.26 MIL/uL (ref 3.87–5.11)
RDW: 13.1 % (ref 11.5–15.5)
WBC: 3.2 10*3/uL — AB (ref 4.0–10.5)

## 2015-03-01 NOTE — Progress Notes (Signed)
Subjective:    [name] is a 39 y.o. female who presents for an annual exam. The patient has no complaints today. The patient is sexually active. GYN screening history: last pap: was normal. The patient wears seatbelts: yes. The patient participates in regular exercise: yes. (MMA) Has the patient ever been transfused or tattooed?: yes. (tattoo) The patient reports that there is not domestic violence in her life.   Menstrual History: OB History    Gravida Para Term Preterm AB TAB SAB Ectopic Multiple Living   5 4        4       Menarche age: 64  Patient's last menstrual period was 02/02/2015.    The following portions of the patient's history were reviewed and updated as appropriate: allergies, current medications, past family history, past medical history, past social history, past surgical history and problem list.  Review of Systems A comprehensive review of systems was negative.    Objective:    BP 114/73 mmHg  Pulse 92  Ht 5\' 4"  (1.626 m)  Wt 182 lb (82.555 kg)  BMI 31.22 kg/m2  LMP 02/02/2015  General Appearance:    Alert, cooperative, no distress, appears stated age  Head:    Normocephalic, without obvious abnormality, atraumatic  Eyes:    PERRL, conjunctiva/corneas clear, EOM's intact, fundi    benign, both eyes  Ears:    Normal TM's and external ear canals, both ears  Nose:   Nares normal, septum midline, mucosa normal, no drainage    or sinus tenderness  Throat:   Lips, mucosa, and tongue normal; teeth and gums normal  Neck:   Supple, symmetrical, trachea midline, no adenopathy;    thyroid:  no enlargement/tenderness/nodules; no carotid   bruit or JVD  Back:     Symmetric, no curvature, ROM normal, no CVA tenderness  Lungs:     Clear to auscultation bilaterally, respirations unlabored  Chest Wall:    No tenderness or deformity   Heart:    Regular rate and rhythm, S1 and S2 normal, no murmur, rub   or gallop  Breast Exam:    No tenderness, masses, or nipple  abnormality  Abdomen:     Soft, non-tender, bowel sounds active all four quadrants,    no masses, no organomegaly  Genitalia:    Normal female without lesion, discharge or tenderness, NSSA, NT, mobile, no adnexal masses     Extremities:   Extremities normal, atraumatic, no cyanosis or edema  Pulses:   2+ and symmetric all extremities  Skin:   Skin color, texture, turgor normal, no rashes or lesions  Lymph nodes:   Cervical, supraclavicular, and axillary nodes normal  Neurologic:   CNII-XII intact, normal strength, sensation and reflexes    throughout  .    Assessment:    Healthy female exam.    Plan:     Breast self exam technique reviewed and patient encouraged to perform self-exam monthly. Thin prep Pap smear. with cotesting STI testing per patient request Fasting labs mammogram

## 2015-03-02 LAB — COMPREHENSIVE METABOLIC PANEL
ALT: 16 U/L (ref 0–35)
AST: 25 U/L (ref 0–37)
Albumin: 4.3 g/dL (ref 3.5–5.2)
Alkaline Phosphatase: 69 U/L (ref 39–117)
BILIRUBIN TOTAL: 0.6 mg/dL (ref 0.2–1.2)
BUN: 11 mg/dL (ref 6–23)
CHLORIDE: 103 meq/L (ref 96–112)
CO2: 23 mEq/L (ref 19–32)
CREATININE: 0.75 mg/dL (ref 0.50–1.10)
Calcium: 9.4 mg/dL (ref 8.4–10.5)
Glucose, Bld: 86 mg/dL (ref 70–99)
POTASSIUM: 4.2 meq/L (ref 3.5–5.3)
Sodium: 140 mEq/L (ref 135–145)
Total Protein: 6.9 g/dL (ref 6.0–8.3)

## 2015-03-02 LAB — HEPATITIS C ANTIBODY: HCV AB: NEGATIVE

## 2015-03-02 LAB — HEPATITIS B SURFACE ANTIGEN: HEP B S AG: NEGATIVE

## 2015-03-02 LAB — RPR

## 2015-03-02 LAB — TSH: TSH: 1.933 u[IU]/mL (ref 0.350–4.500)

## 2015-03-02 LAB — HIV ANTIBODY (ROUTINE TESTING W REFLEX): HIV 1&2 Ab, 4th Generation: NONREACTIVE

## 2015-03-04 LAB — VITAMIN D 1,25 DIHYDROXY
Vitamin D 1, 25 (OH)2 Total: 29 pg/mL (ref 18–72)
Vitamin D2 1, 25 (OH)2: <8 pg/mL
Vitamin D3 1, 25 (OH)2: 29 pg/mL

## 2015-03-05 LAB — CYTOLOGY - PAP

## 2015-03-05 LAB — VITAMIN B1

## 2015-03-29 ENCOUNTER — Other Ambulatory Visit: Payer: Self-pay | Admitting: Physician Assistant

## 2015-04-04 ENCOUNTER — Telehealth: Payer: Self-pay | Admitting: *Deleted

## 2015-04-04 ENCOUNTER — Other Ambulatory Visit (INDEPENDENT_AMBULATORY_CARE_PROVIDER_SITE_OTHER): Payer: No Typology Code available for payment source | Admitting: *Deleted

## 2015-04-04 DIAGNOSIS — F32A Depression, unspecified: Secondary | ICD-10-CM

## 2015-04-04 DIAGNOSIS — G43801 Other migraine, not intractable, with status migrainosus: Secondary | ICD-10-CM

## 2015-04-04 DIAGNOSIS — R300 Dysuria: Secondary | ICD-10-CM | POA: Diagnosis not present

## 2015-04-04 DIAGNOSIS — F329 Major depressive disorder, single episode, unspecified: Secondary | ICD-10-CM

## 2015-04-04 MED ORDER — RIZATRIPTAN BENZOATE 10 MG PO TABS: 10.0000 mg | ORAL_TABLET | Freq: Once | ORAL | Status: DC | PRN

## 2015-04-04 MED ORDER — BUPROPION HCL ER (XL) 150 MG PO TB24: 150.0000 mg | ORAL_TABLET | Freq: Every day | ORAL | Status: DC

## 2015-04-04 NOTE — Telephone Encounter (Signed)
Patient called in and needed refills on her Wellbutrin and Maxalt.  I have sent in refills to the pharmacy.

## 2015-04-04 NOTE — Progress Notes (Signed)
Patient dropped off a urine specimen to be sent off for culture.

## 2015-04-06 LAB — URINE CULTURE

## 2015-04-10 ENCOUNTER — Other Ambulatory Visit (INDEPENDENT_AMBULATORY_CARE_PROVIDER_SITE_OTHER): Payer: No Typology Code available for payment source | Admitting: *Deleted

## 2015-04-10 DIAGNOSIS — R300 Dysuria: Secondary | ICD-10-CM

## 2015-04-10 LAB — POCT URINALYSIS DIPSTICK
Bilirubin, UA: NEGATIVE
Blood, UA: NEGATIVE
Glucose, UA: NEGATIVE
Ketones, UA: NEGATIVE
LEUKOCYTES UA: NEGATIVE
NITRITE UA: NEGATIVE
PH UA: 5
PROTEIN UA: NEGATIVE
Urobilinogen, UA: NEGATIVE

## 2015-04-10 NOTE — Progress Notes (Signed)
Patient came in to drop off a urine specimen.  Patient thinks she has a urinary tract infection.  I will send off for culture.

## 2015-04-12 LAB — URINE CULTURE: Colony Count: 3000

## 2015-08-09 ENCOUNTER — Ambulatory Visit (INDEPENDENT_AMBULATORY_CARE_PROVIDER_SITE_OTHER): Payer: No Typology Code available for payment source | Admitting: Cardiovascular Disease

## 2015-08-09 ENCOUNTER — Encounter: Payer: Self-pay | Admitting: Cardiovascular Disease

## 2015-08-09 VITALS — BP 102/74 | HR 85 | Ht 64.0 in | Wt 168.8 lb

## 2015-08-09 DIAGNOSIS — Z87891 Personal history of nicotine dependence: Secondary | ICD-10-CM | POA: Diagnosis not present

## 2015-08-09 DIAGNOSIS — I4719 Other supraventricular tachycardia: Secondary | ICD-10-CM

## 2015-08-09 DIAGNOSIS — I471 Supraventricular tachycardia: Secondary | ICD-10-CM

## 2015-08-09 DIAGNOSIS — E669 Obesity, unspecified: Secondary | ICD-10-CM | POA: Diagnosis not present

## 2015-08-09 MED ORDER — METOPROLOL TARTRATE 25 MG PO TABS: 25.0000 mg | ORAL_TABLET | Freq: Two times a day (BID) | ORAL | Status: DC | PRN

## 2015-08-09 MED ORDER — PROPRANOLOL HCL 20 MG PO TABS: 20.0000 mg | ORAL_TABLET | Freq: Three times a day (TID) | ORAL | Status: DC | PRN

## 2015-08-09 NOTE — Patient Instructions (Addendum)
You are doing well. No medication changes were made.  propranolol lasts 4 to 6 hours Metoprolol lasts 12 hours  Please call us if you have new issues that need to be addressed before your next appt.  Your physician wants you to follow-up in: 12 months.  You will receive a reminder letter in the mail two months in advance. If you don't receive a letter, please call our office to schedule the follow-up appointment.      Steps to Quit Smoking  Smoking tobacco can be harmful to your health and can affect almost every organ in your body. Smoking puts you, and those around you, at risk for developing many serious chronic diseases. Quitting smoking is difficult, but it is one of the best things that you can do for your health. It is never too late to quit. WHAT ARE THE BENEFITS OF QUITTING SMOKING? When you quit smoking, you lower your risk of developing serious diseases and conditions, such as:  Lung cancer or lung disease, such as COPD.  Heart disease.  Stroke.  Heart attack.  Infertility.  Osteoporosis and bone fractures. Additionally, symptoms such as coughing, wheezing, and shortness of breath may get better when you quit. You may also find that you get sick less often because your body is stronger at fighting off colds and infections. If you are pregnant, quitting smoking can help to reduce your chances of having a baby of low birth weight. HOW DO I GET READY TO QUIT? When you decide to quit smoking, create a plan to make sure that you are successful. Before you quit:  Pick a date to quit. Set a date within the next two weeks to give you time to prepare.  Write down the reasons why you are quitting. Keep this list in places where you will see it often, such as on your bathroom mirror or in your car or wallet.  Identify the people, places, things, and activities that make you want to smoke (triggers) and avoid them. Make sure to take these actions:  Throw away all cigarettes at  home, at work, and in your car.  Throw away smoking accessories, such as Scientist, research (medical).  Clean your car and make sure to empty the ashtray.  Clean your home, including curtains and carpets.  Tell your family, friends, and coworkers that you are quitting. Support from your loved ones can make quitting easier.  Talk with your health care provider about your options for quitting smoking.  Find out what treatment options are covered by your health insurance. WHAT STRATEGIES CAN I USE TO QUIT SMOKING?  Talk with your healthcare provider about different strategies to quit smoking. Some strategies include:  Quitting smoking altogether instead of gradually lessening how much you smoke over a period of time. Research shows that quitting "cold [place]" is more successful than gradually quitting.  Attending in-person counseling to help you build problem-solving skills. You are more likely to have success in quitting if you attend several counseling sessions. Even short sessions of 10 minutes can be effective.  Finding resources and support systems that can help you to quit smoking and remain smoke-free after you quit. These resources are most helpful when you use them often. They can include:  Online chats with a Social worker.  Telephone quitlines.  Printed Furniture conservator/restorer.  Support groups or group counseling.  Text messaging programs.  Mobile phone applications.  Taking medicines to help you quit smoking. (If you are pregnant or breastfeeding, talk with  your health care provider first.) Some medicines contain nicotine and some do not. Both types of medicines help with cravings, but the medicines that include nicotine help to relieve withdrawal symptoms. Your health care provider may recommend:  Nicotine patches, gum, or lozenges.  Nicotine inhalers or sprays.  Non-nicotine medicine that is taken by mouth. Talk with your health care provider about combining strategies, such as  taking medicines while you are also receiving in-person counseling. Using these two strategies together makes you more likely to succeed in quitting than if you used either strategy on its own. If you are pregnant or breastfeeding, talk with your health care provider about finding counseling or other support strategies to quit smoking. Do not take medicine to help you quit smoking unless told to do so by your health care provider. WHAT THINGS CAN I DO TO MAKE IT EASIER TO QUIT? Quitting smoking might feel overwhelming at first, but there is a lot that you can do to make it easier. Take these important actions:  Reach out to your family and friends and ask that they support and encourage you during this time. Call telephone quitlines, reach out to support groups, or work with a counselor for support.  Ask people who smoke to avoid smoking around you.  Avoid places that trigger you to smoke, such as bars, parties, or smoke-break areas at work.  Spend time around people who do not smoke.  Lessen stress in your life, because stress can be a smoking trigger for some people. To lessen stress, try:  Exercising regularly.  Deep-breathing exercises.  Yoga.  Meditating.  Performing a body scan. This involves closing your eyes, scanning your body from head to toe, and noticing which parts of your body are particularly tense. Purposefully relax the muscles in those areas.  Download or purchase mobile phone or tablet apps (applications) that can help you stick to your quit plan by providing reminders, tips, and encouragement. There are many free apps, such as QuitGuide from the [employer] Office manager for Disease Control and Prevention). You can find other support for quitting smoking (smoking cessation) through smokefree.gov and other websites. HOW WILL I FEEL WHEN I QUIT SMOKING? Within the first 24 hours of quitting smoking, you may start to feel some withdrawal symptoms. These symptoms are usually most  noticeable 2-3 days after quitting, but they usually do not last beyond 2-3 weeks. Changes or symptoms that you might experience include:  Mood swings.  Restlessness, anxiety, or irritation.  Difficulty concentrating.  Dizziness.  Strong cravings for sugary foods in addition to nicotine.  Mild weight gain.  Constipation.  Nausea.  Coughing or a sore throat.  Changes in how your medicines work in your body.  A depressed mood.  Difficulty sleeping (insomnia). After the first 2-3 weeks of quitting, you may start to notice more positive results, such as:  Improved sense of smell and taste.  Decreased coughing and sore throat.  Slower heart rate.  Lower blood pressure.  Clearer skin.  The ability to breathe more easily.  Fewer sick days. Quitting smoking is very challenging for most people. Do not get discouraged if you are not successful the first time. Some people need to make many attempts to quit before they achieve long-term success. Do your best to stick to your quit plan, and talk with your health care provider if you have any questions or concerns.   This information is not intended to replace advice given to you by your health care provider.  Make sure you discuss any questions you have with your health care provider.   Document Released: 09/01/2001 Document Revised: 01/22/2015 Document Reviewed: 01/22/2015 Elsevier Interactive Patient Education 2016 [employer]. Smoking Hazards Smoking cigarettes is extremely bad for your health. Tobacco smoke has over 200 known poisons in it. It contains the poisonous gases nitrogen oxide and carbon monoxide. There are over 60 chemicals in tobacco smoke that cause cancer. Some of the chemicals found in cigarette smoke include:   Cyanide.   Benzene.   Formaldehyde.   Methanol (wood alcohol).   Acetylene (fuel used in welding torches).   Ammonia.  Even smoking lightly shortens your life expectancy by several  years. You can greatly reduce the risk of medical problems for you and your family by stopping now. Smoking is the most preventable cause of death and disease in our society. Within days of quitting smoking, your circulation improves, you decrease the risk of having a heart attack, and your lung capacity improves. There may be some increased phlegm in the first few days after quitting, and it may take months for your lungs to clear up completely. Quitting for 10 years reduces your risk of developing lung cancer to almost that of a nonsmoker.  WHAT ARE THE RISKS OF SMOKING? Cigarette smokers have an increased risk of many serious medical problems, including:  Lung cancer.   Lung disease (such as pneumonia, bronchitis, and emphysema).   Heart attack and chest pain due to the heart not getting enough oxygen (angina).   Heart disease and peripheral blood vessel disease.   Hypertension.   Stroke.   Oral cancer (cancer of the lip, mouth, or voice box).   Bladder cancer.   Pancreatic cancer.   Cervical cancer.   Pregnancy complications, including premature birth.   Stillbirths and smaller newborn babies, birth defects, and genetic damage to sperm.   Early menopause.   Lower estrogen level for women.   Infertility.   Facial wrinkles.   Blindness.   Increased risk of broken bones (fractures).   Senile dementia.   Stomach ulcers and internal bleeding.   Delayed wound healing and increased risk of complications during surgery. Because of secondhand smoke exposure, children of smokers have an increased risk of the following:   Sudden infant death syndrome (SIDS).   Respiratory infections.   Lung cancer.   Heart disease.   Ear infections.  WHY IS SMOKING ADDICTIVE? Nicotine is the chemical agent in tobacco that is capable of causing addiction or dependence. When you smoke and inhale, nicotine is absorbed rapidly into the bloodstream through your  lungs. Both inhaled and noninhaled nicotine may be addictive.  WHAT ARE THE BENEFITS OF QUITTING?  There are many health benefits to quitting smoking. Some are:   The likelihood of developing cancer and heart disease decreases. Health improvements are seen almost immediately.   Blood pressure, pulse rate, and breathing patterns start returning to normal soon after quitting.   People who quit may see an improvement in their overall quality of life.  HOW DO YOU QUIT SMOKING? Smoking is an addiction with both physical and psychological effects, and longtime habits can be hard to change. Your health care provider can recommend:  Programs and community resources, which may include group support, education, or therapy.  Replacement products, such as patches, gum, and nasal sprays. Use these products only as directed. Do not replace cigarette smoking with electronic cigarettes (commonly called e-cigarettes). The safety of e-cigarettes is unknown, and some may contain harmful  chemicals. FOR MORE INFORMATION  American Lung Association: www.lung.org  American Cancer Society: www.cancer.org   This information is not intended to replace advice given to you by your health care provider. Make sure you discuss any questions you have with your health care provider.   Document Released: 10/15/2004 Document Revised: 06/28/2013 Document Reviewed: 02/27/2013 Elsevier Interactive Patient Education 2016 [place] WHAT IS SECONDHAND SMOKE? Secondhand smoke is smoke that comes from burning tobacco. It could be the smoke from a cigarette, a pipe, or a cigar. Even if you are not the one smoking, secondhand smoke exposes you to the dangers of smoking. This is called involuntary, or passive, smoking. There are two types of secondhand smoke:  Sidestream smoke is the smoke that comes off the lighted end of a cigarette, pipe, or cigar.  This type of smoke has the highest amount of  cancer-causing agents (carcinogens).  The particles in sidestream smoke are smaller. They get into your lungs more easily.  Mainstream smoke is the smoke that is exhaled by a person who is smoking.  This type of smoke is also dangerous to your health. HOW CAN SECONDHAND SMOKE AFFECT MY HEALTH? Studies show that there is no safe level of secondhand smoke. This smoke contains thousands of chemicals. At least 34 of them are known to cause cancer. Secondhand smoke can also cause many other health problems. It has been linked to:  Lung cancer.  Cancer of the voice box (larynx) or throat.  Cancer of the sinuses.  Brain cancer.  Bladder cancer.  Stomach cancer.  Breast cancer.  White blood cell cancers (lymphoma and leukemia).  Brain and liver tumors in children.  Heart disease and stroke in adults.  Pregnancy loss (miscarriage).  Diseases in children, such as:  Asthma.  Lung infections.  Ear infections.  Sudden infant death syndrome (SIDS).  Slow growth. WHERE CAN I BE AT RISK FOR EXPOSURE TO SECONDHAND SMOKE?   For adults, the workplace is the main source of exposure to secondhand smoke.  Your workplace should have a policy separating smoking areas from nonsmoking areas.  Smoking areas should have a system for ventilating and cleaning the air.  For children, the home may be the most dangerous place for exposure to secondhand smoke.  Children who live in apartment buildings may be at risk from smoke drifting from hallways or other people's homes.  For everyone, many public places are possible sources of exposure to secondhand smoke.  These places include restaurants, shopping centers, and parks. HOW CAN I REDUCE MY RISK FOR EXPOSURE TO SECONDHAND SMOKE? The most important thing you can do is not smoke. Discourage family members from smoking. Other ways to reduce exposure for you and your family include the following:  Keep your home smoke free.  Make sure  your child care providers do not smoke.  Warn your child about the dangers of smoking and secondhand smoke.  Do not allow smoking in your car. When someone smokes in a car, all the damaging chemicals from the smoke are confined in a small area.  Avoid public places where smoking is allowed.   This information is not intended to replace advice given to you by your health care provider. Make sure you discuss any questions you have with your health care provider.   Document Released: 10/15/2004 Document Revised: 09/28/2014 Document Reviewed: 12/22/2013 Elsevier Interactive Patient Education [employer].

## 2015-08-09 NOTE — Progress Notes (Signed)
Patient ID: [name], female    DOB: 02/17/1976, 39 y.o.   MRN: [id number]  HPI Comments: [name] is a pleasant 39 year old woman with long history of arrhythmia dating back several years initially with bradycardia during the pregnancy of her first son and tachycardia with her second son during delivery and in recovery, history of acute onset of tachycardia with rates up to 120s from rest, presents for routine follow-up of her arrhythmia She is a smoker. 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 Perhaps one time per month she has episode of tachycardia lasting 15 minutes Heart rate 120 up to 140 bpm the case Typically she does not take any medication for this but she does have propranolol Tachycardia could occur during the daytime or at nighttime, with exertion or rest. Symptoms are not particular he severe  She continues to smoke, husband smokes She is hoping the Wellbutrin will work  EKG on today's visit shows normal sinus rhythm with rate 85 bpm, no significant ST or T-wave changes  Other past medical history When she has tachycardia, she reports acute onset, acute cessation of the rhythm at termination. Typically coming on at rest She measures her heart rate by manual palpation Denies any significant chest pain with exertion. No lightheadedness or near syncope  Prior echocardiogram in 2012: Essentially normal study Left ventricle: The cavity size was normal. Wall thickness was normal. Systolic function was normal. The estimated ejection fraction was in the range of 60% to 65%. Wall motion was normal; there were no regional wall motion abnormalities. Left ventricular diastolic function parameters were normal.   Allergies  Allergen Reactions  . Other     Red food Coloring  . Penicillins   . Sulfonamide Derivatives     Current Outpatient Prescriptions on File Prior to Visit  Medication Sig  Dispense Refill  . ALPRAZolam (XANAX) 0.25 MG tablet Take 1 tablet (0.25 mg total) by mouth at bedtime as needed for sleep. 30 tablet 0  . buPROPion (WELLBUTRIN XL) 150 MG 24 hr tablet Take 1 tablet (150 mg total) by mouth daily. 30 tablet 6  . ibuprofen (ADVIL,MOTRIN) 200 MG tablet Take 200 mg by mouth as needed.      . rizatriptan (MAXALT) 10 MG tablet Take 1 tablet (10 mg total) by mouth once as needed for migraine. May repeat in 2 hours if needed 12 tablet 11  . zolpidem (AMBIEN) 10 MG tablet Take 1 tablet (10 mg total) by mouth at bedtime as needed for sleep. 30 tablet 3   No current facility-administered medications on file prior to visit.    Past Medical History  Diagnosis Date  . History of tobacco abuse   . Pyelonephritis   . Ovarian cyst   . Interstitial cystitis   . Panic disorder   . Endometriosis   . Arrhythmia     Bradycardia  . Sinus problem     Right maxillary (frequent)  . MRSA (methicillin resistant staph aureus) culture positive 2013    Past Surgical History  Procedure Laterality Date  . Appendectomy    . Laparoscopy      Social History  reports that she has been smoking Cigarettes.  She has a 5.5 pack-year smoking history. She has never used smokeless tobacco. She reports that she drinks about 2.4 oz of alcohol per week. She reports that she does not use illicit drugs.  Family History family history  includes Leukemia in her paternal grandfather. There is no history of Cancer.  Review of Systems  Constitutional: Negative.   Respiratory: Negative.   Cardiovascular: Positive for palpitations.       Tachycardia  Gastrointestinal: Negative.   Musculoskeletal: Negative.   Neurological: Negative.   Hematological: Negative.   Psychiatric/Behavioral: Negative.   All other systems reviewed and are negative.   BP 102/74 mmHg  Pulse 85  Ht 5\' 4"  (1.626 m)  Wt 168 lb 12 oz (76.544 kg)  BMI 28.95 kg/m2  Physical Exam  Constitutional: She is oriented to  person, place, and time. She appears well-developed and well-nourished.  HENT:  Head: Normocephalic.  Nose: Nose normal.  Mouth/Throat: Oropharynx is clear and moist.  Eyes: Conjunctivae are normal. Pupils are equal, round, and reactive to light.  Neck: Normal range of motion. Neck supple. No JVD present.  Cardiovascular: Normal rate, regular rhythm, S1 normal, S2 normal, normal heart sounds and intact distal pulses.  Exam reveals no gallop and no friction rub.   No murmur heard. Pulmonary/Chest: Effort normal and breath sounds normal. No respiratory distress. She has no wheezes. She has no rales. She exhibits no tenderness.  Abdominal: Soft. Bowel sounds are normal. She exhibits no distension. There is no tenderness.  Musculoskeletal: Normal range of motion. She exhibits no edema or tenderness.  Lymphadenopathy:    She has no cervical adenopathy.  Neurological: She is alert and oriented to person, place, and time. Coordination normal.  Skin: Skin is warm and dry. No rash noted. No erythema.  Psychiatric: She has a normal mood and affect. Her behavior is normal. Judgment and thought content normal.    Assessment and Plan  Nursing note and vitals reviewed.

## 2015-08-09 NOTE — Assessment & Plan Note (Signed)
Significant weight loss compared to her prior clinic visit

## 2015-08-09 NOTE — Assessment & Plan Note (Signed)
We have encouraged her to continue to work on weaning her cigarettes and smoking cessation. She will continue to work on this and does not want any assistance with chantix.  

## 2015-08-09 NOTE — Assessment & Plan Note (Signed)
Rare episodes of atrial tachycardia Once per month, lasting 15 minutes. She will continue to take on her propranolol as needed Really has not been taking this as symptoms are so short-lived No further workup at this time

## 2015-09-28 ENCOUNTER — Emergency Department: Payer: No Typology Code available for payment source

## 2015-09-28 ENCOUNTER — Inpatient Hospital Stay
Admission: EM | Admit: 2015-09-28 | Discharge: 2015-10-01 | DRG: 494 | Disposition: A | Discharge status: Home Health | Payer: No Typology Code available for payment source | Attending: Orthopedic Surgery | Admitting: Orthopedic Surgery

## 2015-09-28 DIAGNOSIS — Z880 Allergy status to penicillin: Secondary | ICD-10-CM

## 2015-09-28 DIAGNOSIS — S82853A Displaced trimalleolar fracture of unspecified lower leg, initial encounter for closed fracture: Secondary | ICD-10-CM | POA: Diagnosis present

## 2015-09-28 DIAGNOSIS — W19XXXA Unspecified fall, initial encounter: Secondary | ICD-10-CM | POA: Diagnosis present

## 2015-09-28 DIAGNOSIS — S82852A Displaced trimalleolar fracture of left lower leg, initial encounter for closed fracture: Principal | ICD-10-CM | POA: Diagnosis present

## 2015-09-28 DIAGNOSIS — Z79899 Other long term (current) drug therapy: Secondary | ICD-10-CM | POA: Diagnosis not present

## 2015-09-28 DIAGNOSIS — Z882 Allergy status to sulfonamides status: Secondary | ICD-10-CM | POA: Diagnosis not present

## 2015-09-28 DIAGNOSIS — S82892A Other fracture of left lower leg, initial encounter for closed fracture: Secondary | ICD-10-CM

## 2015-09-28 DIAGNOSIS — Z91018 Allergy to other foods: Secondary | ICD-10-CM | POA: Diagnosis not present

## 2015-09-28 DIAGNOSIS — F17210 Nicotine dependence, cigarettes, uncomplicated: Secondary | ICD-10-CM | POA: Diagnosis present

## 2015-09-28 HISTORY — DX: Headache, unspecified: R51.9

## 2015-09-28 HISTORY — DX: Headache: R51

## 2015-09-28 LAB — CBC
HCT: 41.4 % (ref 35.0–47.0)
Hemoglobin: 13.9 g/dL (ref 12.0–16.0)
MCH: 32.5 pg (ref 26.0–34.0)
MCHC: 33.6 g/dL (ref 32.0–36.0)
MCV: 96.5 fL (ref 80.0–100.0)
Platelets: 254 K/uL (ref 150–440)
RBC: 4.29 MIL/uL (ref 3.80–5.20)
RDW: 12.5 % (ref 11.5–14.5)
WBC: 4.8 K/uL (ref 3.6–11.0)

## 2015-09-28 LAB — COMPREHENSIVE METABOLIC PANEL
ALBUMIN: 4.4 g/dL (ref 3.5–5.0)
ALT: 15 U/L (ref 14–54)
ANION GAP: 8 (ref 5–15)
AST: 19 U/L (ref 15–41)
Alkaline Phosphatase: 77 U/L (ref 38–126)
BILIRUBIN TOTAL: 0.5 mg/dL (ref 0.3–1.2)
BUN: 6 mg/dL (ref 6–20)
CO2: 24 mmol/L (ref 22–32)
Calcium: 8.4 mg/dL — ABNORMAL LOW (ref 8.9–10.3)
Chloride: 109 mmol/L (ref 101–111)
Creatinine, Ser: 0.78 mg/dL (ref 0.44–1.00)
GFR calc Af Amer: >60 mL/min (ref >60)
Glucose, Bld: 107 mg/dL — ABNORMAL HIGH (ref 65–99)
POTASSIUM: 3.9 mmol/L (ref 3.5–5.1)
Sodium: 141 mmol/L (ref 135–145)
TOTAL PROTEIN: 7.5 g/dL (ref 6.5–8.1)

## 2015-09-28 LAB — TROPONIN I: Troponin I: <0.03 ng/mL (ref <0.031)

## 2015-09-28 MED ORDER — SODIUM CHLORIDE 0.9 % IV BOLUS (SEPSIS)
1000.0000 mL | Freq: Once | INTRAVENOUS | Status: AC
  Administered 2015-09-28: 1000 mL via INTRAVENOUS

## 2015-09-28 MED ORDER — MORPHINE SULFATE (PF) 2 MG/ML IV SOLN
2.0000 mg | INTRAVENOUS | Status: DC | PRN
  Administered 2015-09-29 (×2): 2 mg via INTRAVENOUS
  Filled 2015-09-28 (×2): qty 1

## 2015-09-28 MED ORDER — SODIUM CHLORIDE 0.9 % IV SOLN
INTRAVENOUS | Status: DC
  Administered 2015-09-29 (×2): via INTRAVENOUS

## 2015-09-28 MED ORDER — CLINDAMYCIN PHOSPHATE 600 MG/50ML IV SOLN
600.0000 mg | Freq: Once | INTRAVENOUS | Status: AC
  Administered 2015-09-29: 600 mg via INTRAVENOUS
  Filled 2015-09-28: qty 50

## 2015-09-28 MED ORDER — HYDROCODONE-ACETAMINOPHEN 5-325 MG PO TABS
1.0000 | ORAL_TABLET | Freq: Four times a day (QID) | ORAL | Status: DC | PRN
  Administered 2015-09-30: 2 via ORAL
  Administered 2015-09-30: 1 via ORAL
  Administered 2015-10-01: 2 via ORAL
  Filled 2015-09-28 (×2): qty 2
  Filled 2015-09-28: qty 1

## 2015-09-28 NOTE — ED Notes (Signed)
Xray called at this time, on way.

## 2015-09-28 NOTE — ED Notes (Signed)
Patient transported to CT 

## 2015-09-28 NOTE — ED Provider Notes (Signed)
[facility] Department Provider Note  ____________________________________________  Time seen: Approximately 11:06 PM  I have reviewed the triage vital signs and the nursing notes.   HISTORY  Chief Complaint Ankle Injury    HPI [name] is a 40 y.o. female who comes into the hospital today with ankle pain after a fall. The patient was at home and had been drinking earlier when she had a fall. The patient's significant other reports that she turned around and there was a spot on the floor that was wet with ice and she just fell. The patient is having pain in her left ankle that she rates a 4-5 out of 10 in intensity. According to the patient's husband she passed out for about 10 minutes after the fall. The patient has been drinking a lot tonight but her husband could not quantify. He is also concerned that she may have been taking Xanax. The patient is still able to move her toes and has sensation in her feet. The patient has no other symptoms at this time reports no back pain no neck pain no chest pain shortness of breath dizziness.   Past Medical History  Diagnosis Date  . History of tobacco abuse   . Pyelonephritis   . Ovarian cyst   . Interstitial cystitis   . Panic disorder   . Endometriosis   . Sinus problem     Right maxillary (frequent)  . MRSA (methicillin resistant staph aureus) culture positive 2013  . Medical history non-contributory   . Arrhythmia     Bradycardia  . Headache     Patient Active Problem List   Diagnosis Date Noted  . Trimalleolar fracture of ankle, closed 09/28/2015  . Atrial tachycardia ([place]) 08/09/2015  . Atrial tachycardia, paroxysmal ([place]) 01/05/2014  . Obesity (BMI 30-39.9) 12/19/2013  . Migraine without aura 12/29/2011  . Anxiety 12/29/2011  . Insomnia 12/29/2011  . CIGARETTE SMOKER 10/08/2010  . BRADYCARDIA 06/12/2009  . CAROTID BRUIT 06/12/2009  . PYELONEPHRITIS 06/08/2009  . INTERSTITIAL  CYSTITIS 06/08/2009  . ENDOMETRIOSIS 06/08/2009  . OVARIAN CYST 06/08/2009  . PANIC DISORDER, HX OF 06/08/2009  . TOBACCO ABUSE, HX OF 06/08/2009    Past Surgical History  Procedure Laterality Date  . Appendectomy    . Laparoscopy    . No past surgeries      No current outpatient prescriptions on file.  Allergies Other; Penicillins; and Sulfonamide derivatives  Family History  Problem Relation Age of Onset  . Cancer Neg Hx     No obvious cancer  . Leukemia Paternal Grandfather     Social History Social History  Substance Use Topics  . Smoking status: Current Every Day Smoker -- 0.50 packs/day for 11 years    Types: Cigarettes  . Smokeless tobacco: Never Used  . Alcohol Use: 2.4 oz/week    4 Glasses of wine per week    Review of Systems Constitutional: No fever/chills Eyes: No visual changes. ENT: No sore throat. Cardiovascular: Denies chest pain. Respiratory: Denies shortness of breath. Gastrointestinal: No abdominal pain.  No nausea, no vomiting.  No diarrhea.  No constipation. Genitourinary: Negative for dysuria. Musculoskeletal: Left ankle pain Skin: Negative for rash. Neurological: Negative for headaches, focal weakness or numbness.  10-point ROS otherwise negative.  ____________________________________________   PHYSICAL EXAM:  VITAL SIGNS: ED Triage Vitals  Enc Vitals Group     BP 09/28/15 2216 109/73 mmHg     Pulse Rate 09/28/15 2216 115  Resp 09/28/15 2216 12     Temp --      Temp Source 09/28/15 2216 Oral     SpO2 09/28/15 2216 96 %     Weight 09/28/15 2216 163 lb 2.3 oz (74 kg)     Height 09/28/15 2216 5\' 4"  (1.626 m)     Head Cir --      Peak Flow --      Pain Score --      Pain Loc --      Pain Edu? --      Excl. in [place]? --     Constitutional: Alert and oriented. Well appearing and in moderate distress. Eyes: Conjunctivae are normal. PERRL. EOMI. Head: Atraumatic. Nose: No congestion/rhinnorhea. Mouth/Throat: Mucous membranes  are moist.  Oropharynx non-erythematous. Neck: No cervical spine tenderness to palpation. Cardiovascular: Normal rate, regular rhythm. Grossly normal heart sounds.  Good peripheral circulation. Respiratory: Normal respiratory effort.  No retractions. Lungs CTAB. Gastrointestinal: Soft and nontender. No distention. Positive bowel sounds Musculoskeletal: Left ankle swelling and pain with passive range of motion, color most and and sensation intact, 2+ peripheral pulses. Neurologic:  Normal speech and language. No gross focal neurologic deficits are appreciated. Skin:  Skin is warm, dry and intact.  Psychiatric: Mood and affect are normal.   ____________________________________________   LABS (all labs ordered are listed, but only abnormal results are displayed)  Labs Reviewed  COMPREHENSIVE METABOLIC PANEL - Abnormal; Notable for the following:    Glucose, Bld 107 (*)    Calcium 8.4 (*)    All other components within normal limits  ETHANOL - Abnormal; Notable for the following:    Alcohol, Ethyl (B) 334 (*)    All other components within normal limits  SURGICAL PCR SCREEN  CBC  TROPONIN I  PREGNANCY, URINE   ____________________________________________  EKG  ED ECG REPORT I, Loney Hering, the attending physician, personally viewed and interpreted this ECG.   Date: 09/28/2015  EKG Time: 2222  Rate: 112  Rhythm: sinus tachycardia  Axis: normal  Intervals:none  ST&T Change: none  ____________________________________________  RADIOLOGY  CT head: Normal noncontrast CT head Ankle complete left: Ankle fractures as described with fractures of the fibula across the matter diaphysis, across the base of the medial malleolus and of the posterior malleolus, mild fracture displacement talus is subluxed posteriorly and laterally but not dislocated. ____________________________________________   PROCEDURES  Procedure(s) performed: Please, see procedure note(s).   SPLINT  APPLICATION Date/Time:  [id number] PM Authorized by: Charlesetta Ivory P Consent: Verbal consent obtained. Risks and benefits: risks, benefits and alternatives were discussed Consent given by: patient Splint applied by: ED technician Location details: left ankle Splint type: stirrup Supplies used: orthoglass Post-procedure: The splinted body part was neurovascularly unchanged following the procedure. Patient tolerance: Patient tolerated the procedure well with no immediate complications.     Critical Care performed: No  ____________________________________________   INITIAL IMPRESSION / ASSESSMENT AND PLAN / ED COURSE  Pertinent labs & imaging results that were available during my care of the patient were reviewed by me and considered in my medical decision making (see chart for details).  This is a 40 year old female who comes into the hospital today with a left ankle fracture. It appears as though the patient has a trimalleolar fracture. I will place the patient in a splint and contact orthopedic surgery a the patient will receive a liter of normal saline.  I contacted Dr. Rudene Christians and he will admit the patient to  the orthopedic surgery service. The patient does have a very high blood alcohol level but I will give her a liter of normal saline and reassess the patient. The patient splint appeared well and she was able to move her toes and had good sensation. She will be admitted to the orthopedic surgery service. ____________________________________________   FINAL CLINICAL IMPRESSION(S) / ED DIAGNOSES  Final diagnoses:  Trimalleolar fracture, left, closed, initial encounter      Loney Hering, MD 09/29/15 [phone]

## 2015-09-28 NOTE — ED Notes (Signed)
Odette Horns and MD Joni Fears at bedside at this time

## 2015-09-28 NOTE — ED Notes (Signed)
Pt with etoh, boyfriend states fell from standing. Pt with deformed left ankle, boyfriend states pt may have struck her head. 2+ left pedal pulse noted.

## 2015-09-28 NOTE — ED Notes (Signed)
Lab called regarding add on labs, will add on at this time.

## 2015-09-29 ENCOUNTER — Inpatient Hospital Stay: Payer: No Typology Code available for payment source | Admitting: Anesthesiology

## 2015-09-29 ENCOUNTER — Inpatient Hospital Stay: Payer: No Typology Code available for payment source

## 2015-09-29 ENCOUNTER — Encounter
Admission: EM | Disposition: A | Discharge status: Home Health | Payer: Self-pay | Source: Home / Self Care | Attending: Orthopedic Surgery

## 2015-09-29 HISTORY — PX: ORIF ANKLE FRACTURE: SHX5408

## 2015-09-29 LAB — SURGICAL PCR SCREEN
MRSA, PCR: NEGATIVE
STAPHYLOCOCCUS AUREUS: NEGATIVE

## 2015-09-29 LAB — ETHANOL: ALCOHOL ETHYL (B): 334 mg/dL — AB (ref <5)

## 2015-09-29 LAB — PREGNANCY, URINE: Preg Test, Ur: NEGATIVE

## 2015-09-29 SURGERY — OPEN REDUCTION INTERNAL FIXATION (ORIF) ANKLE FRACTURE
Anesthesia: Spinal | Laterality: Left | Wound class: Clean

## 2015-09-29 MED ORDER — VITAMIN B-1 100 MG PO TABS
100.0000 mg | ORAL_TABLET | Freq: Every day | ORAL | Status: DC
  Administered 2015-09-30 – 2015-10-01 (×2): 100 mg via ORAL
  Filled 2015-09-29 (×2): qty 1

## 2015-09-29 MED ORDER — METOCLOPRAMIDE HCL 5 MG/ML IJ SOLN: 5.0000 mg | Freq: Three times a day (TID) | INTRAMUSCULAR | Status: DC | PRN

## 2015-09-29 MED ORDER — METOCLOPRAMIDE HCL 5 MG PO TABS: 5.0000 mg | ORAL_TABLET | Freq: Three times a day (TID) | ORAL | Status: DC | PRN

## 2015-09-29 MED ORDER — PROPRANOLOL HCL 20 MG PO TABS: 20.0000 mg | ORAL_TABLET | Freq: Three times a day (TID) | ORAL | Status: DC | PRN

## 2015-09-29 MED ORDER — ADULT MULTIVITAMIN W/MINERALS CH
1.0000 | ORAL_TABLET | Freq: Every day | ORAL | Status: DC
  Administered 2015-09-30 – 2015-10-01 (×2): 1 via ORAL
  Filled 2015-09-29 (×2): qty 1

## 2015-09-29 MED ORDER — NEOMYCIN-POLYMYXIN B GU 40-200000 IR SOLN
Status: AC
  Filled 2015-09-29: qty 4

## 2015-09-29 MED ORDER — MORPHINE SULFATE (PF) 2 MG/ML IV SOLN
2.0000 mg | INTRAVENOUS | Status: DC | PRN
  Administered 2015-09-29 – 2015-09-30 (×8): 2 mg via INTRAVENOUS
  Filled 2015-09-29 (×8): qty 1

## 2015-09-29 MED ORDER — ENOXAPARIN SODIUM 40 MG/0.4ML ~~LOC~~ SOLN
40.0000 mg | SUBCUTANEOUS | Status: DC
  Administered 2015-09-30 – 2015-10-01 (×2): 40 mg via SUBCUTANEOUS
  Filled 2015-09-29 (×2): qty 0.4

## 2015-09-29 MED ORDER — LORAZEPAM 2 MG/ML IJ SOLN: 1.0000 mg | Freq: Four times a day (QID) | INTRAMUSCULAR | Status: DC | PRN

## 2015-09-29 MED ORDER — SODIUM CHLORIDE 0.9 % IV SOLN
INTRAVENOUS | Status: DC
  Administered 2015-09-29: 22:00:00 via INTRAVENOUS

## 2015-09-29 MED ORDER — ACETAMINOPHEN 650 MG RE SUPP: 650.0000 mg | Freq: Four times a day (QID) | RECTAL | Status: DC | PRN

## 2015-09-29 MED ORDER — FENTANYL CITRATE (PF) 100 MCG/2ML IJ SOLN
INTRAMUSCULAR | Status: DC | PRN
  Administered 2015-09-29 (×2): 50 ug via INTRAVENOUS

## 2015-09-29 MED ORDER — MAGNESIUM CITRATE PO SOLN
1.0000 | Freq: Once | ORAL | Status: DC | PRN
  Filled 2015-09-29: qty 296

## 2015-09-29 MED ORDER — NEOMYCIN-POLYMYXIN B GU 40-200000 IR SOLN
Status: DC | PRN
  Administered 2015-09-29: 1 mL

## 2015-09-29 MED ORDER — MAGNESIUM HYDROXIDE 400 MG/5ML PO SUSP: 30.0000 mL | Freq: Every day | ORAL | Status: DC | PRN

## 2015-09-29 MED ORDER — CLINDAMYCIN PHOSPHATE 600 MG/50ML IV SOLN
600.0000 mg | Freq: Four times a day (QID) | INTRAVENOUS | Status: AC
  Administered 2015-09-29 – 2015-09-30 (×3): 600 mg via INTRAVENOUS
  Filled 2015-09-29 (×3): qty 50

## 2015-09-29 MED ORDER — BISACODYL 5 MG PO TBEC
5.0000 mg | DELAYED_RELEASE_TABLET | Freq: Every day | ORAL | Status: DC | PRN
  Filled 2015-09-29: qty 1

## 2015-09-29 MED ORDER — ONDANSETRON HCL 4 MG PO TABS: 4.0000 mg | ORAL_TABLET | Freq: Four times a day (QID) | ORAL | Status: DC | PRN

## 2015-09-29 MED ORDER — ALPRAZOLAM 0.25 MG PO TABS: 0.2500 mg | ORAL_TABLET | Freq: Every evening | ORAL | Status: DC | PRN

## 2015-09-29 MED ORDER — PROMETHAZINE HCL 25 MG/ML IJ SOLN: 6.2500 mg | INTRAMUSCULAR | Status: DC | PRN

## 2015-09-29 MED ORDER — ACETAMINOPHEN 325 MG PO TABS
650.0000 mg | ORAL_TABLET | Freq: Four times a day (QID) | ORAL | Status: DC | PRN
Start: 2015-09-29 — End: 2015-10-01
  Administered 2015-09-30 – 2015-10-01 (×2): 650 mg via ORAL
  Filled 2015-09-29 (×2): qty 2

## 2015-09-29 MED ORDER — BUPIVACAINE HCL (PF) 0.5 % IJ SOLN
INTRAMUSCULAR | Status: AC
  Filled 2015-09-29: qty 30

## 2015-09-29 MED ORDER — FOLIC ACID 1 MG PO TABS
1.0000 mg | ORAL_TABLET | Freq: Every day | ORAL | Status: DC
  Administered 2015-09-30 – 2015-10-01 (×2): 1 mg via ORAL
  Filled 2015-09-29 (×2): qty 1

## 2015-09-29 MED ORDER — ONDANSETRON HCL 4 MG/2ML IJ SOLN: 4.0000 mg | Freq: Four times a day (QID) | INTRAMUSCULAR | Status: DC | PRN

## 2015-09-29 MED ORDER — PROPOFOL 500 MG/50ML IV EMUL
INTRAVENOUS | Status: DC | PRN
  Administered 2015-09-29: 75 ug/kg/min via INTRAVENOUS

## 2015-09-29 MED ORDER — OXYCODONE HCL 5 MG PO TABS
5.0000 mg | ORAL_TABLET | ORAL | Status: DC | PRN
  Administered 2015-09-30 – 2015-10-01 (×8): 10 mg via ORAL
  Filled 2015-09-29 (×4): qty 2
  Filled 2015-09-29: qty 1
  Filled 2015-09-29 (×4): qty 2

## 2015-09-29 MED ORDER — LORAZEPAM 1 MG PO TABS
1.0000 mg | ORAL_TABLET | Freq: Four times a day (QID) | ORAL | Status: DC | PRN
Start: 2015-09-29 — End: 2015-10-01
  Administered 2015-09-30 (×2): 1 mg via ORAL
  Filled 2015-09-29 (×2): qty 1

## 2015-09-29 MED ORDER — DOCUSATE SODIUM 100 MG PO CAPS
100.0000 mg | ORAL_CAPSULE | Freq: Two times a day (BID) | ORAL | Status: DC
  Administered 2015-09-29 – 2015-10-01 (×4): 100 mg via ORAL
  Filled 2015-09-29 (×4): qty 1

## 2015-09-29 MED ORDER — BUPROPION HCL ER (XL) 150 MG PO TB24
150.0000 mg | ORAL_TABLET | Freq: Every day | ORAL | Status: DC
  Administered 2015-09-29 – 2015-10-01 (×3): 150 mg via ORAL
  Filled 2015-09-29 (×3): qty 1

## 2015-09-29 MED ORDER — ZOLPIDEM TARTRATE 5 MG PO TABS: 10.0000 mg | ORAL_TABLET | Freq: Every evening | ORAL | Status: DC | PRN

## 2015-09-29 MED ORDER — LORAZEPAM 2 MG/ML IJ SOLN
0.0000 mg | Freq: Four times a day (QID) | INTRAMUSCULAR | Status: AC
  Administered 2015-09-29: 1 mg via INTRAVENOUS
  Filled 2015-09-29: qty 1

## 2015-09-29 MED ORDER — FENTANYL CITRATE (PF) 100 MCG/2ML IJ SOLN: 25.0000 ug | INTRAMUSCULAR | Status: DC | PRN

## 2015-09-29 MED ORDER — MIDAZOLAM HCL 5 MG/5ML IJ SOLN
INTRAMUSCULAR | Status: DC | PRN
  Administered 2015-09-29: 2 mg via INTRAVENOUS

## 2015-09-29 MED ORDER — LORAZEPAM 2 MG/ML IJ SOLN: 0.0000 mg | Freq: Two times a day (BID) | INTRAMUSCULAR | Status: DC

## 2015-09-29 MED ORDER — SUMATRIPTAN SUCCINATE 50 MG PO TABS
50.0000 mg | ORAL_TABLET | ORAL | Status: DC | PRN
  Filled 2015-09-29: qty 1

## 2015-09-29 MED ORDER — METOPROLOL TARTRATE 25 MG PO TABS
25.0000 mg | ORAL_TABLET | Freq: Two times a day (BID) | ORAL | Status: DC | PRN
  Administered 2015-09-29 – 2015-09-30 (×2): 25 mg via ORAL
  Filled 2015-09-29 (×2): qty 1

## 2015-09-29 MED ORDER — BUPIVACAINE HCL (PF) 0.5 % IJ SOLN
INTRAMUSCULAR | Status: DC | PRN
  Administered 2015-09-29: 3 mL

## 2015-09-29 MED ORDER — THIAMINE HCL 100 MG/ML IJ SOLN
100.0000 mg | Freq: Every day | INTRAMUSCULAR | Status: DC
  Filled 2015-09-29: qty 2

## 2015-09-29 SURGICAL SUPPLY — 59 items
BANDAGE ACE 4X5 VEL STRL LF (GAUZE/BANDAGES/DRESSINGS) ×2 IMPLANT
BIT DRILL 2.5X2.75 QC CALB (BIT) ×1 IMPLANT
BIT DRILL 2.9 CANN QC NONSTRL (BIT) ×1 IMPLANT
BIT DRILL 2.9X70 QC CALB (BIT) ×1 IMPLANT
BLADE SURG SZ10 CARB STEEL (BLADE) ×4 IMPLANT
BNDG ESMARK 4X12 TAN STRL LF (GAUZE/BANDAGES/DRESSINGS) ×2 IMPLANT
CANISTER SUCT 1200ML W/VALVE (MISCELLANEOUS) ×2 IMPLANT
CHLORAPREP W/TINT 26ML (MISCELLANEOUS) ×3 IMPLANT
DRAPE C-ARM XRAY 36X54 (DRAPES) ×2 IMPLANT
DRAPE FLUOR MINI C-ARM 54X84 (DRAPES) ×1 IMPLANT
DRAPE INCISE IOBAN 66X45 STRL (DRAPES) ×2 IMPLANT
DRAPE U-SHAPE 47X51 STRL (DRAPES) ×2 IMPLANT
DRSG EMULSION OIL 3X8 NADH (GAUZE/BANDAGES/DRESSINGS) ×1 IMPLANT
ELECT CAUTERY BLADE 6.4 (BLADE) ×2 IMPLANT
FIXATION ZIPTIGHT ANKLE SNDSMS (Ankle) IMPLANT
GAUZE PETRO XEROFOAM 1X8 (MISCELLANEOUS) ×2 IMPLANT
GAUZE SPONGE 4X4 12PLY STRL (GAUZE/BANDAGES/DRESSINGS) ×2 IMPLANT
GAUZE XEROFORM 4X4 STRL (GAUZE/BANDAGES/DRESSINGS) ×1 IMPLANT
GLOVE BIOGEL PI IND STRL 9 (GLOVE) ×1 IMPLANT
GLOVE BIOGEL PI INDICATOR 9 (GLOVE) ×1
GLOVE INDICATOR 7.5 STRL GRN (GLOVE) ×3 IMPLANT
GLOVE SURG ORTHO 9.0 STRL STRW (GLOVE) ×2 IMPLANT
GOWN SPECIALTY ULTRA XL (MISCELLANEOUS) ×2 IMPLANT
GOWN STRL REUS W/ TWL LRG LVL3 (GOWN DISPOSABLE) ×1 IMPLANT
GOWN STRL REUS W/TWL LRG LVL3 (GOWN DISPOSABLE) ×2
HEMOVAC 400ML (MISCELLANEOUS)
K-WIRE ACE 1.6X6 (WIRE) ×4
KIT DRAIN HEMOVAC JP 7FR 400ML (MISCELLANEOUS) ×1 IMPLANT
KIT RM TURNOVER STRD PROC AR (KITS) ×2 IMPLANT
KWIRE ACE 1.6X6 (WIRE) IMPLANT
LABEL OR SOLS (LABEL) ×2 IMPLANT
NS IRRIG 1000ML POUR BTL (IV SOLUTION) ×2 IMPLANT
PACK EXTREMITY ARMC (MISCELLANEOUS) ×2 IMPLANT
PAD ABD DERMACEA PRESS 5X9 (GAUZE/BANDAGES/DRESSINGS) ×2 IMPLANT
PAD CAST CTTN 4X4 STRL (SOFTGOODS) ×2 IMPLANT
PAD GROUND ADULT SPLIT (MISCELLANEOUS) ×2 IMPLANT
PAD PREP 24X41 OB/GYN DISP (PERSONAL CARE ITEMS) ×2 IMPLANT
PADDING CAST COTTON 4X4 STRL (SOFTGOODS) ×4
PLATE ACE 100DEG 7HOLE (Plate) ×1 IMPLANT
SCREW ACE CAN 4.0 34M (Screw) ×1 IMPLANT
SCREW CORTICAL 3.5MM  16MM (Screw) ×2 IMPLANT
SCREW CORTICAL 3.5MM 14MM (Screw) ×1 IMPLANT
SCREW CORTICAL 3.5MM 16MM (Screw) IMPLANT
SCREW NLOCK CANC HEX 4X12 (Screw) ×1 IMPLANT
SCREW NLOCK CANC HEX 4X14 (Screw) ×1 IMPLANT
SPLINT CAST 1 STEP 5X30 WHT (MISCELLANEOUS) ×1 IMPLANT
SPONGE LAP 18X18 5 PK (GAUZE/BANDAGES/DRESSINGS) ×1 IMPLANT
STAPLER SKIN PROX 35W (STAPLE) ×2 IMPLANT
STOCKINETTE STRL 6IN 960660 (GAUZE/BANDAGES/DRESSINGS) ×2 IMPLANT
SUT ETHILON 3-0 FS-10 30 BLK (SUTURE)
SUT MNCRL AB 4-0 PS2 18 (SUTURE) ×2 IMPLANT
SUT VIC AB 0 CT1 36 (SUTURE) ×1 IMPLANT
SUT VIC AB 2-0 SH 27 (SUTURE) ×2
SUT VIC AB 2-0 SH 27XBRD (SUTURE) ×2 IMPLANT
SUT VIC AB 3-0 SH 27 (SUTURE)
SUT VIC AB 3-0 SH 27X BRD (SUTURE) ×1 IMPLANT
SUTURE EHLN 3-0 FS-10 30 BLK (SUTURE) ×1 IMPLANT
SYRINGE 10CC LL (SYRINGE) ×2 IMPLANT
ZIPTIGHT ANKLE SYNODESMOSS FIX (Ankle) ×2 IMPLANT

## 2015-09-29 NOTE — Plan of Care (Signed)
Problem: Pain Managment: Goal: General experience of comfort will improve Outcome: Not Progressing IV pain medication not decreasing pain.  Problem: Skin Integrity: Goal: Risk for impaired skin integrity will decrease Outcome: Not Progressing Bedrest  Problem: Activity: Goal: Risk for activity intolerance will decrease Outcome: Not Progressing Bedrest     Problem: Fluid Volume: Goal: Ability to maintain a balanced intake and output will improve Outcome: Not Progressing NPO  Problem: Nutrition: Goal: Adequate nutrition will be maintained Outcome: Not Progressing NPO

## 2015-09-29 NOTE — ED Notes (Signed)
MD Rudene Christians paged at this time, RN made MD [facility] aware of ETOH. Verbalized understanding, no further orders given at this time

## 2015-09-29 NOTE — Anesthesia Preprocedure Evaluation (Signed)
Anesthesia Evaluation  Patient identified by MRN, date of birth, ID band Patient awake    Reviewed: Allergy & Precautions, H&P , NPO status , Patient's Chart, lab work & pertinent test results, reviewed documented beta blocker date and time   History of Anesthesia Complications Negative for: history of anesthetic complications  Airway Mallampati: III  TM Distance: >3 FB Neck ROM: full    Dental no notable dental hx. (+) Caps, Teeth Intact   Pulmonary neg shortness of breath, neg sleep apnea, neg COPD, neg recent URI, Current Smoker,    Pulmonary exam normal breath sounds clear to auscultation       Cardiovascular Exercise Tolerance: Good (-) hypertension(-) angina(-) CAD, (-) Past MI, (-) Cardiac Stents and (-) CABG Normal cardiovascular exam+ dysrhythmias (Bradycardia) (-) Valvular Problems/Murmurs Rhythm:regular Rate:Normal     Neuro/Psych  Headaches, neg Seizures PSYCHIATRIC DISORDERS (Panic disorder)    GI/Hepatic negative GI ROS, Neg liver ROS,   Endo/Other  negative endocrine ROS  Renal/GU Renal disease (Pyelonephritis)  negative genitourinary   Musculoskeletal   Abdominal   Peds  Hematology negative hematology ROS (+)   Anesthesia Other Findings Past Medical History:   History of tobacco abuse                                     Pyelonephritis                                               Ovarian cyst                                                 Interstitial cystitis                                        Panic disorder                                               Endometriosis                                                Sinus problem                                                  Comment:Right maxillary (frequent)   MRSA (methicillin resistant staph aureus) cult* 2013         Medical history non-contributory                             Arrhythmia  Comment:Bradycardia   Headache                                                     Reproductive/Obstetrics negative OB ROS                             Anesthesia Physical Anesthesia Plan  ASA: II  Anesthesia Plan: Spinal   Post-op Pain Management:    Induction:   Airway Management Planned:   Additional Equipment:   Intra-op Plan:   Post-operative Plan:   Informed Consent: I have reviewed the patients History and Physical, chart, labs and discussed the procedure including the risks, benefits and alternatives for the proposed anesthesia with the patient or authorized representative who has indicated his/her understanding and acceptance.   Dental Advisory Given  Plan Discussed with: Anesthesiologist, CRNA and Surgeon  Anesthesia Plan Comments:         Anesthesia Quick Evaluation

## 2015-09-29 NOTE — H&P (Signed)
Subjective:   Patient is a 40 y.o. female presents with left ankle pain. Onset of symptoms was abrupt starting 10 hours ago with unchanged course since that time. The pain is located all around the ankle. Patient describes the pain as sharp continuous and rated as moderate, severe and localized. Pain has been associated with a fall on her driveway on the ice last night. Patient denies loss of consciousness.. Past history includes no prior ankle problems.   Patient Active Problem List   Diagnosis Date Noted  . Trimalleolar fracture of ankle, closed 09/28/2015  . Atrial tachycardia ([place]) 08/09/2015  . Atrial tachycardia, paroxysmal ([place]) 01/05/2014  . Obesity (BMI 30-39.9) 12/19/2013  . Migraine without aura 12/29/2011  . Anxiety 12/29/2011  . Insomnia 12/29/2011  . CIGARETTE SMOKER 10/08/2010  . BRADYCARDIA 06/12/2009  . CAROTID BRUIT 06/12/2009  . PYELONEPHRITIS 06/08/2009  . INTERSTITIAL CYSTITIS 06/08/2009  . ENDOMETRIOSIS 06/08/2009  . OVARIAN CYST 06/08/2009  . PANIC DISORDER, HX OF 06/08/2009  . TOBACCO ABUSE, HX OF 06/08/2009   Past Medical History  Diagnosis Date  . History of tobacco abuse   . Pyelonephritis   . Ovarian cyst   . Interstitial cystitis   . Panic disorder   . Endometriosis   . Sinus problem     Right maxillary (frequent)  . MRSA (methicillin resistant staph aureus) culture positive 2013  . Medical history non-contributory   . Arrhythmia     Bradycardia  . Headache     Past Surgical History  Procedure Laterality Date  . Appendectomy    . Laparoscopy    . No past surgeries      Prescriptions prior to admission  Medication Sig Dispense Refill Last Dose  . buPROPion (WELLBUTRIN XL) 150 MG 24 hr tablet Take 1 tablet (150 mg total) by mouth daily. 30 tablet 6 09/28/2015 at Unknown time  . ibuprofen (ADVIL,MOTRIN) 200 MG tablet Take 200 mg by mouth as needed.     prn  . rizatriptan (MAXALT) 10 MG tablet Take 1 tablet (10 mg total) by mouth once as needed  for migraine. May repeat in 2 hours if needed 12 tablet 11 prn  . ALPRAZolam (XANAX) 0.25 MG tablet Take 1 tablet (0.25 mg total) by mouth at bedtime as needed for sleep. 30 tablet 0 Taking  . metoprolol tartrate (LOPRESSOR) 25 MG tablet Take 1 tablet (25 mg total) by mouth 2 (two) times daily as needed (tachycardia). 60 tablet 3   . propranolol (INDERAL) 20 MG tablet Take 1 tablet (20 mg total) by mouth 3 (three) times daily as needed (tachycardia). 90 tablet 4   . zolpidem (AMBIEN) 10 MG tablet Take 1 tablet (10 mg total) by mouth at bedtime as needed for sleep. 30 tablet 3 Taking   Allergies  Allergen Reactions  . Other     Red food Coloring  . Penicillins   . Sulfonamide Derivatives     Social History  Substance Use Topics  . Smoking status: Current Every Day Smoker -- 0.50 packs/day for 11 years    Types: Cigarettes  . Smokeless tobacco: Never Used  . Alcohol Use: 2.4 oz/week    4 Glasses of wine per week    Family History  Problem Relation Age of Onset  . Cancer Neg Hx     No obvious cancer  . Leukemia Paternal Grandfather     Review of Systems Pertinent items are noted in HPI.  Objective:   Patient Vitals for the past 8 hrs:  BP Temp Temp src Pulse Resp SpO2 Weight  09/29/15 0718 (!) 108/57 mmHg 98.7 F (37.1 C) Oral (!) 105 16 99 % -  09/29/15 0145 (!) 111/59 mmHg 98.7 F (37.1 C) Oral (!) 119 18 100 % 75.524 kg (166 lb 8 oz)  09/29/15 0123 111/75 mmHg - - (!) 110 16 99 % -   I/O last 3 completed shifts: In: -  Out: 250 [Urine:250]      BP 108/57 mmHg  Pulse 105  Temp(Src) 98.7 F (37.1 C) (Oral)  Resp 16  Ht 5\' 4"  (1.626 m)  Wt 75.524 kg (166 lb 8 oz)  BMI 28.57 kg/m2  SpO2 99%  LMP 09/25/2015 General appearance: alert, cooperative and mild distress Lungs: clear to auscultation bilaterally Heart: regular rate and rhythm, S1, S2 normal, no murmur, click, rub or gallop Extremities: Left leg and posterior and stirrup splint. Brisk capillary refill.  Sensation intact to the toes   Data ReviewRadiology review: Ankle x-rays show try ankle fracture with posterior subluxation syndesmosis rupture  Assessment:   Active Problems:   Trimalleolar fracture of ankle, closed   Plan:   ORIF trimalleolar ankle fracture with ORIF medial lateral syndesmosis repair

## 2015-09-29 NOTE — Op Note (Signed)
09/28/2015 - 09/29/2015  9:03 PM  PATIENT:  [name]  40 y.o. female  PRE-OPERATIVE DIAGNOSIS:  left ankle fracture trimalleolar  POST-OPERATIVE DIAGNOSIS:  left ankle fracture  PROCEDURE:  Procedure(s): OPEN REDUCTION INTERNAL FIXATION (ORIF) ANKLE FRACTURE (Left) ORIF medial and lateral malleoli with syndesmosis repair SURGEON: Laurene Footman, MD  ASSISTANTS: None  ANESTHESIA:   spinal  EBL:  Total I/O In: 500 [I.V.:500] Out: -   BLOOD ADMINISTERED:none  DRAINS: none   LOCAL MEDICATIONS USED:  NONE  SPECIMEN:  No Specimen  DISPOSITION OF SPECIMEN:  N/A  COUNTS:  YES  TOURNIQUET:  * No tourniquets in log * tourniquet 51 minutes at 300 mmHg  IMPLANTS: Biomet 1/3 tubular plate with screws and zip tight syndesmosis device, 4.0 cannulated screw medial  DICTATION: .Dragon Dictation patient brought to the operating room and after adequate spinal anesthesia was obtained, the left leg was prepped and draped in usual sterile fashion after a bump was applied under the left buttock and a tourniquet applied. After patient identification and timeout procedures were completed tourniquet was raised. Posterior lateral approach was made to the fibula with exposure of the fibula and the fracture. A reduction clamp was used to get essentially anatomic alignment of the fracture although there was some comminution. A 7 hole third tubular plate was contoured to fit the distal fibula and 3 proximal cortical screws were placed. 2 distal cancellus screws were placed and then with a large bone-holding clamp the repeat syndesmosis was held in reduced position. The zip tight wire was passed followed by drilling and then placing the device was tightened and the sutures removed. Prior to this a single 4.0 cannulated screws used to fix the medial malleolar fragment which extended quite anteriorly. Near anatomic alignment was obtained. After this zip tight had been placed, stress views showed no  instability to the mortise or anterior posterior displacement. The wounds were irrigated and closed with 2-0 Vicryl subcutaneously and skin staples. Xeroform 4 x 4's web roll and stirrup splint applied followed by an Ace wrap. Tourniquet was let down prior to close the case and patient center comes stable condition  PLAN OF CARE: Continue as inpatient  PATIENT DISPOSITION:  PACU - hemodynamically stable.

## 2015-09-29 NOTE — Anesthesia Procedure Notes (Signed)
Spinal Patient location during procedure: OR Start time: 09/29/2015 7:36 PM End time: 09/29/2015 7:40 PM Staffing Anesthesiologist: Martha Clan Performed by: anesthesiologist  Preanesthetic Checklist Completed: patient identified, site marked, surgical consent, pre-op evaluation, timeout performed, IV checked, risks and benefits discussed and monitors and equipment checked Spinal Block Patient position: sitting Prep: ChloraPrep Patient monitoring: heart rate, continuous pulse ox, blood pressure and cardiac monitor Approach: midline Location: L4-5 Injection technique: single-shot Needle Needle type: Whitacre and Introducer  Needle gauge: 25 G Needle length: 9 cm Additional Notes Negative paresthesia. Negative blood return. Positive free-flowing CSF. Expiration date of kit checked and confirmed. Patient tolerated procedure well, without complications.

## 2015-09-29 NOTE — Transfer of Care (Signed)
Immediate Anesthesia Transfer of Care Note  Patient: [name]  Procedure(s) Performed: Procedure(s): OPEN REDUCTION INTERNAL FIXATION (ORIF) ANKLE FRACTURE (Left)  Patient Location: PACU  Anesthesia Type:MAC and Spinal  Level of Consciousness: awake, alert  and oriented  Airway & Oxygen Therapy: Patient Spontanous Breathing  Post-op Assessment: Report given to RN and Post -op Vital signs reviewed and stable  Post vital signs: Reviewed and stable  Last Vitals:  Filed Vitals:   09/29/15 1507 09/29/15 2101  BP: 126/71 125/82  Pulse: 97 116  Temp: 36.9 C 36.4 C  Resp: 16 13    Complications: No apparent anesthesia complications and Patient re-intubated

## 2015-09-29 NOTE — Progress Notes (Signed)
Patient was requiring IV pain medication every hour and half.  Appeared to be anxious and concerned about surgery happening so late, gave Ativan and this has helped patient calm down a lot and pain to subside.

## 2015-09-29 NOTE — ED Notes (Signed)
ETOH: 334 PER LAB

## 2015-09-30 ENCOUNTER — Encounter: Payer: Self-pay | Admitting: Orthopedic Surgery

## 2015-09-30 MED ORDER — NICOTINE 14 MG/24HR TD PT24
14.0000 mg | MEDICATED_PATCH | Freq: Every day | TRANSDERMAL | Status: DC
  Administered 2015-09-30 – 2015-10-01 (×2): 14 mg via TRANSDERMAL
  Filled 2015-09-30 (×2): qty 1

## 2015-09-30 MED ORDER — OXYCODONE HCL 5 MG PO TABS: 5.0000 mg | ORAL_TABLET | ORAL | Status: DC | PRN

## 2015-09-30 MED ORDER — ASPIRIN EC 325 MG PO TBEC: 325.0000 mg | DELAYED_RELEASE_TABLET | Freq: Every day | ORAL | Status: DC

## 2015-09-30 NOTE — Discharge Summary (Signed)
Physician Discharge Summary  Subjective: 1 Day Post-Op Procedure(s) (LRB): OPEN REDUCTION INTERNAL FIXATION (ORIF) ANKLE FRACTURE (Left) Patient reports pain as moderate.   Patient seen in rounds with Dr. Rudene Christians. Patient is well, and has had no acute complaints or problems Patient is ready to go home after physical therapy today. The patient will most likely go home today, if better pain control.  Physician Discharge Summary  Patient ID: [name] MRN: [id number] DOB/AGE: 12-09-1975 40 y.o.  Admit date: 09/28/2015 Discharge date: 10/01/2015  Admission Diagnoses:  Discharge Diagnoses:  Active Problems:   Trimalleolar fracture of ankle, closed   Discharged Condition: fair  Hospital Course: The patient is postop day 1 left ankle ORIF for trimalleolar fracture. The patient is to work with physical therapy on postop day 1. She will then most likely go home. We'll need dressing change or cast application on Friday.  Treatments: surgery:  PROCEDURE: Procedure(s): OPEN REDUCTION INTERNAL FIXATION (ORIF) ANKLE FRACTURE (Left) ORIF medial and lateral malleoli with syndesmosis repair SURGEON: Laurene Footman, MD  ASSISTANTS: None  ANESTHESIA: spinal  EBL: Total I/O In: 500 [I.V.:500] Out: -   BLOOD ADMINISTERED:none  DRAINS: none   LOCAL MEDICATIONS USED: NONE  SPECIMEN: No Specimen  DISPOSITION OF SPECIMEN: N/A  COUNTS: YES  TOURNIQUET: * No tourniquets in log * tourniquet 51 minutes at 300 mmHg  IMPLANTS: Biomet 1/3 tubular plate with screws and zip tight syndesmosis device, 4.0 cannulated screw medial  Discharge Exam: Blood pressure 131/64, pulse 94, temperature 99.8 F (37.7 C), temperature source Oral, resp. rate 18, height 5\' 4"  (1.626 m), weight 75.524 kg (166 lb 8 oz), last menstrual period 09/25/2015, SpO2 99 %.   Disposition: 01-Home or Self Care     Medication List    TAKE these medications        ALPRAZolam 0.25 MG tablet  Commonly  known as:  XANAX  Take 1 tablet (0.25 mg total) by mouth at bedtime as needed for sleep.     buPROPion 150 MG 24 hr tablet  Commonly known as:  WELLBUTRIN XL  Take 1 tablet (150 mg total) by mouth daily.     ibuprofen 200 MG tablet  Commonly known as:  ADVIL,MOTRIN  Take 200 mg by mouth as needed.     metoprolol tartrate 25 MG tablet  Commonly known as:  LOPRESSOR  Take 1 tablet (25 mg total) by mouth 2 (two) times daily as needed (tachycardia).     oxyCODONE 5 MG immediate release tablet  Commonly known as:  Oxy IR/ROXICODONE  Take 1-2 tablets (5-10 mg total) by mouth every 4 (four) hours as needed for breakthrough pain.     propranolol 20 MG tablet  Commonly known as:  INDERAL  Take 1 tablet (20 mg total) by mouth 3 (three) times daily as needed (tachycardia).     rizatriptan 10 MG tablet  Commonly known as:  MAXALT  Take 1 tablet (10 mg total) by mouth once as needed for migraine. May repeat in 2 hours if needed     zolpidem 10 MG tablet  Commonly known as:  AMBIEN  Take 1 tablet (10 mg total) by mouth at bedtime as needed for sleep.           Follow-up Information    Follow up with MENZ,MICHAEL, MD In 10 days.   Specialty:  Orthopedic Surgery   Why:  For wound re-check and staple or suture removal   Contact information:   [address]  [place] [address] [phone]       Signed: Prescott Parma, Leahna Hewson 09/30/2015, 7:58 AM   Objective: Vital signs in last 24 hours: Temp:  [97.5 F (36.4 C)-99.8 F (37.7 C)] 99.8 F (37.7 C) (01/09 0738) Pulse Rate:  [70-116] 94 (01/09 0738) Resp:  [13-20] 18 (01/09 0738) BP: (100-131)/(58-82) 131/64 mmHg (01/09 0738) SpO2:  [99 %-100 %] 99 % (01/09 0738)  Intake/Output from previous day:  Intake/Output Summary (Last 24 hours) at 09/30/15 0758 Last data filed at 09/29/15 2130  Gross per 24 hour  Intake    600 ml  Output     10 ml  Net    590 ml    Intake/Output this shift:     Labs:  Recent Labs  09/28/15 2220  HGB 13.9    Recent Labs  09/28/15 2220  WBC 4.8  RBC 4.29  HCT 41.4  PLT 254    Recent Labs  09/28/15 2220  NA 141  K 3.9  CL 109  CO2 24  BUN 6  CREATININE 0.78  GLUCOSE 107*  CALCIUM 8.4*   No results for input(s): LABPT, INR in the last 72 hours.  EXAM: General - Patient is Alert Extremity - Neurovascular intact Sensation intact distally Incision - clean, dry, no drainage Motor Function -  the patient is able to do a straight leg raise.  Assessment/Plan: 1 Day Post-Op Procedure(s) (LRB): OPEN REDUCTION INTERNAL FIXATION (ORIF) ANKLE FRACTURE (Left) Procedure(s) (LRB): OPEN REDUCTION INTERNAL FIXATION (ORIF) ANKLE FRACTURE (Left) Past Medical History  Diagnosis Date  . History of tobacco abuse   . Pyelonephritis   . Ovarian cyst   . Interstitial cystitis   . Panic disorder   . Endometriosis   . Sinus problem     Right maxillary (frequent)  . MRSA (methicillin resistant staph aureus) culture positive 2013  . Medical history non-contributory   . Arrhythmia     Bradycardia  . Headache    Active Problems:   Trimalleolar fracture of ankle, closed  Estimated body mass index is 28.57 kg/(m^2) as calculated from the following:   Height as of this encounter: 5\' 4"  (1.626 m).   Weight as of this encounter: 75.524 kg (166 lb 8 oz). Up with therapy Diet - Regular diet Follow up - in 3 days for dressing change and cast application Activity - toe-touch weight-bear with crutches Disposition - Home Condition Upon Discharge - Stable DVT Prophylaxis - Aspirin  Reche Dixon, PA-C Orthopaedic Surgery 09/30/2015, 7:58 AM

## 2015-09-30 NOTE — Progress Notes (Signed)
  Subjective: 1 Day Post-Op Procedure(s) (LRB): OPEN REDUCTION INTERNAL FIXATION (ORIF) ANKLE FRACTURE (Left) Patient reports pain as moderate.   Patient seen in rounds with Dr. Rudene Christians. Patient is well, and has had no acute complaints or problems Plan is to go Home after hospital stay. Negative for chest pain and shortness of breath Fever: no Gastrointestinal: Negative for nausea and vomiting  Objective: Vital signs in last 24 hours: Temp:  [97.5 F (36.4 C)-99.8 F (37.7 C)] 99.8 F (37.7 C) (01/09 0738) Pulse Rate:  [70-116] 94 (01/09 0738) Resp:  [13-20] 18 (01/09 0738) BP: (100-131)/(58-82) 131/64 mmHg (01/09 0738) SpO2:  [99 %-100 %] 99 % (01/09 0738)  Intake/Output from previous day:  Intake/Output Summary (Last 24 hours) at 09/30/15 0749 Last data filed at 09/29/15 2130  Gross per 24 hour  Intake    600 ml  Output     10 ml  Net    590 ml    Intake/Output this shift:    Labs:  Recent Labs  09/28/15 2220  HGB 13.9    Recent Labs  09/28/15 2220  WBC 4.8  RBC 4.29  HCT 41.4  PLT 254    Recent Labs  09/28/15 2220  NA 141  K 3.9  CL 109  CO2 24  BUN 6  CREATININE 0.78  GLUCOSE 107*  CALCIUM 8.4*   No results for input(s): LABPT, INR in the last 72 hours.   EXAM General - Patient is Alert and Oriented Extremity - Neurovascular intact Sensation intact distally Dressing/Incision - clean, dry, no drainage. The posterior splint is intact. Motor Function - intact, moving foot and toes well on exam. The patient is able to do a straight leg raise independently.  Past Medical History  Diagnosis Date  . History of tobacco abuse   . Pyelonephritis   . Ovarian cyst   . Interstitial cystitis   . Panic disorder   . Endometriosis   . Sinus problem     Right maxillary (frequent)  . MRSA (methicillin resistant staph aureus) culture positive 2013  . Medical history non-contributory   . Arrhythmia     Bradycardia  . Headache      Assessment/Plan: 1 Day Post-Op Procedure(s) (LRB): OPEN REDUCTION INTERNAL FIXATION (ORIF) ANKLE FRACTURE (Left) Active Problems:   Trimalleolar fracture of ankle, closed  Estimated body mass index is 28.57 kg/(m^2) as calculated from the following:   Height as of this encounter: 5\' 4"  (1.626 m).   Weight as of this encounter: 75.524 kg (166 lb 8 oz). Advance diet Up with therapy Plan for discharge tomorrow  DVT Prophylaxis - Lovenox Toe-touch Weight-Bearing as tolerated to left leg  Reche Dixon, PA-C Orthopaedic Surgery 09/30/2015, 7:49 AM

## 2015-09-30 NOTE — Anesthesia Postprocedure Evaluation (Signed)
Anesthesia Post Note  Patient: [name]  Procedure(s) Performed: Procedure(s) (LRB): OPEN REDUCTION INTERNAL FIXATION (ORIF) ANKLE FRACTURE (Left)  Patient location during evaluation: Nursing Unit Anesthesia Type: Spinal Level of consciousness: awake and alert and oriented Pain management: satisfactory to patient Vital Signs Assessment: post-procedure vital signs reviewed and stable Respiratory status: respiratory function stable Cardiovascular status: stable Anesthetic complications: no    Last Vitals:  Filed Vitals:   09/30/15 0150 09/30/15 0534  BP: 111/60 111/59  Pulse: 74 74  Temp: 36.8 C 37.6 C  Resp: 18 18    Last Pain:  Filed Vitals:   09/30/15 0534  PainSc: 7                  Blima Singer

## 2015-09-30 NOTE — Discharge Instructions (Signed)
INSTRUCTIONS AFTER Surgery  o Remove items at home which could result in a fall. This includes throw rugs or furniture in walking pathways o ICE to the affected joint every three hours while awake for 30 minutes at a time, for at least the first 3-5 days, and then as needed for pain and swelling.  Continue to use ice for pain and swelling. You may notice swelling that will progress down to the foot and ankle.  This is normal after surgery.  Elevate your leg when you are not up walking on it.   o Continue to use the breathing machine you got in the hospital (incentive spirometer) which will help keep your temperature down.  It is common for your temperature to cycle up and down following surgery, especially at night when you are not up moving around and exerting yourself.  The breathing machine keeps your lungs expanded and your temperature down.   DIET:  As you were doing prior to hospitalization, we recommend a well-balanced diet.  DRESSING / WOUND CARE / SHOWERING  Keep her splint dry with no showering. Keep your splint and dressings in place. If your wearing a short leg cast keep the cast dry. Staples and stitches will be removed around 10 days.  ACTIVITY  o Increase activity slowly as tolerated, but follow the weight bearing instructions below.   o No driving for 6 weeks or until further direction given by your physician.  You cannot drive while taking narcotics.  o No lifting or carrying greater than 10 lbs. until further directed by your surgeon. o Avoid periods of inactivity such as sitting longer than an hour when not asleep. This helps prevent blood clots.  o You may return to work once you are authorized by your doctor.     WEIGHT BEARING  Toe-touch weight-bear with crutches   EXERCISES Walking around with crutches as tolerated.  CONSTIPATION  Constipation is defined medically as fewer than three stools per week and severe constipation as less than one stool per week.  Even  if you have a regular bowel pattern at home, your normal regimen is likely to be disrupted due to multiple reasons following surgery.  Combination of anesthesia, postoperative narcotics, change in appetite and fluid intake all can affect your bowels.   YOU MUST use at least one of the following options; they are listed in order of increasing strength to get the job done.  They are all available over the counter, and you may need to use some, POSSIBLY even all of these options:    Drink plenty of fluids (prune juice may be helpful) and high fiber foods Colace 100 mg by mouth twice a day  Senokot for constipation as directed and as needed Dulcolax (bisacodyl), take with full glass of water  Miralax (polyethylene glycol) once or twice a day as needed.  If you have tried all these things and are unable to have a bowel movement in the first 3-4 days after surgery call either your surgeon or your primary doctor.    If you experience loose stools or diarrhea, hold the medications until you stool forms back up.  If your symptoms do not get better within 1 week or if they get worse, check with your doctor.  If you experience "the worst abdominal pain ever" or develop nausea or vomiting, please contact the office immediately for further recommendations for treatment.   ITCHING:  If you experience itching with your medications, try taking only a single  pain pill, or even half a pain pill at a time.  You can also use Benadryl over the counter for itching or also to help with sleep.   TED HOSE STOCKINGS:  Use stockings on both legs until for at least 2 weeks or as directed by physician office. They may be removed at night for sleeping.  MEDICATIONS:  See your medication summary on the After Visit Summary that nursing will review with you.  You may have some home medications which will be placed on hold until you complete the course of blood thinner medication.  It is important for you to complete the blood  thinner medication as prescribed.  PRECAUTIONS:  If you experience chest pain or shortness of breath - call 911 immediately for transfer to the hospital emergency department.   If you develop a fever greater that 101 F, purulent drainage from wound, increased redness or drainage from wound, foul odor from the wound/dressing, or calf pain - CONTACT YOUR SURGEON.                                                   FOLLOW-UP APPOINTMENTS:  If you do not already have a post-op appointment, please call the office for an appointment to be seen by your surgeon.  Guidelines for how soon to be seen are listed in your After Visit Summary, but are typically between 1-4 weeks after surgery.  OTHER INSTRUCTIONS:   Keep your leg elevated and use ice to decrease swelling.  MAKE SURE YOU:   Understand these instructions.   Get help right away if you are not doing well or get worse.    Thank you for letting us be a part of your medical care team.  It is a privilege we respect greatly.  We hope these instructions will help you stay on track for a fast and full recovery!

## 2015-09-30 NOTE — Progress Notes (Signed)
Patient pain managed with po medication.  Low grade temp this evening, educated incentive spirometer, gave tylenol.  Heart rate elevated, patient had PRN tachycardia medication, gave dose.  Patient denies any feelings of heart racing or palpatations.

## 2015-09-30 NOTE — Care Management Note (Signed)
Case Management Note  Patient Details  Name: [name] MRN: [id number] Date of Birth: 02-19-1976  Subjective/Objective:                  Met with patient to discuss discharge planning. She has crutches. She will need a walker and knee scooter to assist with ambulation and her job. She lives with her boyfriend and 41 y/o child. She uses [facility] on [address] for Rx. Her PCP is Dr. Freddi Starr. No HHPT needed per PT.  Action/Plan: I have requested rolling walker and knee scooter from Will with [facility]. No further RNCM needs. Case closed.   Expected Discharge Date:                  Expected Discharge Plan:     In-House Referral:     Discharge planning Services  CM Consult  Post Acute Care Choice:  Durable Medical Equipment Choice offered to:  Patient  DME Arranged:  Gilford Rile (knee scooter) DME Agency:  [place]:    [place] Agency:     Status of Service:  Completed, signed off  Medicare Important Message Given:    Date Medicare IM Given:    Medicare IM give by:    Date Additional Medicare IM Given:    Additional Medicare Important Message give by:     If discussed at [place] of Stay Meetings, dates discussed:    Additional Comments:  Marshell Garfinkel, RN 09/30/2015, 11:44 AM

## 2015-09-30 NOTE — Evaluation (Signed)
Physical Therapy Evaluation Patient Details Name: DEZI TUBERVILLE MRN: [id number] DOB: Dec 30, 1975 Today's Date: 09/30/2015   History of Present Illness  pt presents s/p L ankle ORIF 09/29/15 after a fall 09/28/15 resulting in a trimalleolar fracture. she has a history of pyelonephritis and arrythmia. pt reports she is doing pretty well, however her pain is pretty bad this morning.   Clinical Impression  Pt presents doing well s/p L ankle ORIF following a trimalleolar fracture. Pt was able to successfully transfer with proper precautions (pt elected to be NWB today due to pain, but able to correctly verbalize TTWB) using a RW. She was able to ambulate x 16ft using RW and NWB with SBA with no instances of LOB. Pt does have 2 small steps to enter her home with railing. Pt was instructed that she can scoot up the steps on her rear end or hop NWB as she did using the railing. Will attempt this afternoon if pain is better controlled, however PT anticipates that she would be able to do this independently based on her PLOF and good performance today.     Follow Up Recommendations No PT follow up    Equipment Recommendations  Rolling walker with 5" wheels (knee scooter)    Recommendations for Other Services       Precautions / Restrictions Restrictions Weight Bearing Restrictions: Yes LLE Weight Bearing: Touchdown weight bearing      Mobility  Bed Mobility Overal bed mobility: Independent                Transfers Overall transfer level: Modified independent Equipment used: Rolling walker (2 wheeled)             General transfer comment: pt performed sit to stand / stand to sit using RW, proper hand placement following instructions   Ambulation/Gait Ambulation/Gait assistance: Modified independent (Device/Increase time);Supervision Ambulation Distance (Feet): 30 Feet Assistive device: Rolling walker (2 wheeled) Gait Pattern/deviations: Step-to pattern   Gait velocity  interpretation: <1.8 ft/sec, indicative of risk for recurrent falls General Gait Details: pt unable to tolerate TT WB today due to pain, but was able to sucessfully ambulate NWB using RW x 27ft with min cues to push RW further in front. pt able to correctly verbalize toe touch WB precautions.   Stairs            Wheelchair Mobility    Modified Rankin (Stroke Patients Only)       Balance Overall balance assessment: Modified Independent                                           Pertinent Vitals/Pain Pain Assessment: 0-10 Pain Score: 7  Pain Location: L ankle  Pain Descriptors / Indicators: Aching;Burning Pain Intervention(s): Limited activity within patient's tolerance;Monitored during session;Patient requesting pain meds-RN notified (pt not able to tolerate toe touch WB, but verbalized proper precautions)    Home Living Family/patient expects to be discharged to:: Private residence Living Arrangements: Spouse/significant other;Children Available Help at Discharge: Family Type of Home: House Home Access: Stairs to enter Entrance Stairs-Rails: Right Entrance Stairs-Number of Steps: 2 short with [place]: Two level;Able to live on main level with bedroom/bathroom Home Equipment: Crutches      Prior Function Level of Independence: Independent         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  Hand Dominance   Dominant Hand: Right    Extremity/Trunk Assessment   Upper Extremity Assessment: Overall WFL for tasks assessed           Lower Extremity Assessment: LLE deficits/detail   LLE Deficits / Details: not tested due to post op status  Cervical / Trunk Assessment: Normal  Communication   Communication: No difficulties  Cognition Arousal/Alertness: Awake/alert Behavior During Therapy: WFL for tasks assessed/performed Overall Cognitive Status: Within Functional Limits for tasks assessed       Memory:  Decreased recall of precautions              General Comments      Exercises        Assessment/Plan    PT Assessment Patient needs continued PT services  PT Diagnosis Difficulty walking   PT Problem List Decreased activity tolerance;Decreased mobility  PT Treatment Interventions DME instruction;Gait training;Stair training;Patient/family education   PT Goals (Current goals can be found in the Care Plan section) Acute Rehab PT Goals Patient Stated Goal: go home and be able to get around PT Goal Formulation: With patient Time For Goal Achievement: 10/07/15 Potential to Achieve Goals: Good    Frequency BID   Barriers to discharge   none    Co-evaluation               End of Session Equipment Utilized During Treatment: Gait belt Activity Tolerance: Patient limited by pain;Patient tolerated treatment well Patient left: in bed;with call bell/phone within reach;with nursing/sitter in room (L leg elevated) Nurse Communication: Mobility status (pt may benefit from knee scooter to work activities )         Time: [id number] PT Time Calculation (min) (ACUTE ONLY): 22 min   Charges:   PT Evaluation $PT Eval Low Complexity: 1 Procedure     PT G Codes:       Ryon Layton C. Orean Giarratano, PT, DPT [phone]  Debroah Shuttleworth 09/30/2015, 9:49 AM

## 2015-10-01 LAB — CREATININE, SERUM
Creatinine, Ser: 0.67 mg/dL (ref 0.44–1.00)
GFR calc non Af Amer: >60 mL/min (ref >60)

## 2015-10-01 LAB — CBC WITH DIFFERENTIAL/PLATELET
BASOS ABS: 0 10*3/uL (ref 0–0.1)
Basophils Relative: 1 %
EOS ABS: 0 10*3/uL (ref 0–0.7)
EOS PCT: 1 %
HCT: 35.3 % (ref 35.0–47.0)
HEMOGLOBIN: 11.9 g/dL — AB (ref 12.0–16.0)
LYMPHS ABS: 0.8 10*3/uL — AB (ref 1.0–3.6)
LYMPHS PCT: 20 %
MCH: 32.4 pg (ref 26.0–34.0)
MCHC: 33.6 g/dL (ref 32.0–36.0)
MCV: 96.4 fL (ref 80.0–100.0)
Monocytes Absolute: 0.5 10*3/uL (ref 0.2–0.9)
Monocytes Relative: 11 %
NEUTROS PCT: 67 %
Neutro Abs: 2.8 10*3/uL (ref 1.4–6.5)
Platelets: 163 10*3/uL (ref 150–440)
RBC: 3.66 MIL/uL — AB (ref 3.80–5.20)
RDW: 12.4 % (ref 11.5–14.5)
WBC: 4.2 10*3/uL (ref 3.6–11.0)

## 2015-10-01 NOTE — Progress Notes (Signed)
Paged MD, notified of change in HR. Advised to ambulate pt and monitor for changes. Ambulated pt to BR and in hallway, HR increased to 124. Pt rested and gradually reduced HR, rested for approx 5 mins and reduced to 90-100. Per Dr. Rudene Christians, pt should follow up with PCP regarding HR. Continue with at-home meds, pt takes metoprolol PRN for elevated HR.

## 2015-10-01 NOTE — Progress Notes (Signed)
   Subjective: 2 Days Post-Op Procedure(s) (LRB): OPEN REDUCTION INTERNAL FIXATION (ORIF) ANKLE FRACTURE (Left) Patient reports pain as mild.   Patient is well, and has had no acute complaints or problems We will start therapy today.  Plan is to go Home after hospital stay.  Objective: Vital signs in last 24 hours: Temp:  [97.9 F (36.6 C)-99.8 F (37.7 C)] 99.1 F (37.3 C) (01/10 0741) Pulse Rate:  [85-126] 100 (01/10 0741) Resp:  [16-18] 16 (01/10 0741) BP: (101-133)/(50-61) 109/54 mmHg (01/10 0741) SpO2:  [98 %-100 %] 100 % (01/10 0741)  Intake/Output from previous day: 01/09 0701 - 01/10 0700 In: 240 [P.O.:240] Out: 1000 [Urine:1000] Intake/Output this shift:     Recent Labs  09/28/15 2220 10/01/15 0435  HGB 13.9 11.9*    Recent Labs  09/28/15 2220 10/01/15 0435  WBC 4.8 4.2  RBC 4.29 3.66*  HCT 41.4 35.3  PLT 254 163    Recent Labs  09/28/15 2220 10/01/15 0435  NA 141  --   K 3.9  --   CL 109  --   CO2 24  --   BUN 6  --   CREATININE 0.78 0.67  GLUCOSE 107*  --   CALCIUM 8.4*  --    No results for input(s): LABPT, INR in the last 72 hours.  EXAM General - Patient is Alert, Appropriate and Oriented Extremity - Neurovascular intact Sensation intact distally Intact pulses distally Dorsiflexion/Plantar flexion intact Dressing - dressing C/D/I, scant drainage and new dressing with well padded cast applied Motor Function - intact, moving toes well on exam.  Past Medical History  Diagnosis Date  . History of tobacco abuse   . Pyelonephritis   . Ovarian cyst   . Interstitial cystitis   . Panic disorder   . Endometriosis   . Sinus problem     Right maxillary (frequent)  . MRSA (methicillin resistant staph aureus) culture positive 2013  . Medical history non-contributory   . Arrhythmia     Bradycardia  . Headache       Assessment/Plan:   2 Days Post-Op Procedure(s) (LRB): OPEN REDUCTION INTERNAL FIXATION (ORIF) ANKLE FRACTURE  (Left) Active Problems:   Trimalleolar fracture of ankle, closed  Estimated body mass index is 28.57 kg/(m^2) as calculated from the following:   Height as of this encounter: 5\' 4"  (1.626 m).   Weight as of this encounter: 75.524 kg (166 lb 8 oz). Advance diet Up with therapy, TTWB LLE Short Leg Cast applied today to LLE Discharge home today, follow up with [facility] in 10-12 days   DVT Prophylaxis - Lovenox TTWB LLE D/C O2 and Pulse OX and try on Room Air  T. Rachelle Hora, PA-C [place] 10/01/2015, 8:05 AM

## 2015-10-01 NOTE — Progress Notes (Signed)
PT reports pt's HR elevated during stairs, pt became hot, sweaty, HR at 150. Gradually decreased with rest. Pt now states she feels fine, has hx of arrythmias at times. HR now at 100. Will continue to monitor, notify MD of change in HR.

## 2015-10-01 NOTE — Progress Notes (Signed)
Physical Therapy Treatment Patient Details Name: [name] MRN: [id number] DOB: [birth year] Today's Date: 10/01/2015    History of Present Illness Pt presents s/p L ankle ORIF 09/29/15 after a fall 09/28/15 resulting in a trimalleolar fracture. she has a history of pyelonephritis and arrythmia.    PT Comments    Pt modified independent with bed mobility, transfers, short ambulation distance with RW (pt preferred to stay NWB), and utilizing knee scooter.  Pt demonstrates very good balance with all activities.  Pt navigated 3 stairs with railing and axillary crutch with CGA.  After pt finished stairs, pt reported feeling hot and sweaty (HR noted to be 150 bpm); after rest in bed pt's HR decreased back to 100 bpm within a few minutes (nursing notified of HR noted above).  Plan for discharge home when medically appropriate.   Follow Up Recommendations  No PT follow up     Equipment Recommendations  Rolling walker with 5" wheels (knee scooter)    Recommendations for Other Services       Precautions / Restrictions Precautions Precautions: Fall Restrictions Weight Bearing Restrictions: Yes Other Position/Activity Restrictions: TTWB L LE    Mobility  Bed Mobility Overal bed mobility: Independent             General bed mobility comments: Supine to/from sit  Transfers Overall transfer level: Modified independent Equipment used: Rolling walker (2 wheeled)             General transfer comment: Sit to/from stand with RW; steady with proper hand placement  Ambulation/Gait Ambulation/Gait assistance: Modified independent (Device/Increase time) Ambulation Distance (Feet): 20 Feet Assistive device: Rolling walker (2 wheeled) Gait Pattern/deviations: Step-to pattern     General Gait Details: pt kept [place] L LE; pt ambulated to bathroom and then switched to knee scooter to go to trial stairs; pt steady without loss of balance.  Pt navigated 40 feet x2 with knee scooter  modified independently and able to safely and appropriately use brakes and get on/off scooter safely.   Stairs Stairs: Yes Stairs assistance: Min guard Stair Management: One rail Left;Forwards;With crutches;Step to pattern Number of Stairs: 3 General stair comments: pt required initial vc's and and demo for stairs technique but then able to perform stairs safely and without any further cueing  Wheelchair Mobility    Modified Rankin (Stroke Patients Only)       Balance Overall balance assessment: Modified Independent                                  Cognition Arousal/Alertness: Awake/alert Behavior During Therapy: WFL for tasks assessed/performed Overall Cognitive Status: Within Functional Limits for tasks assessed                      Exercises      General Comments General comments (skin integrity, edema, etc.): L ankle cast in place  Nursing cleared pt for participation in physical therapy.  Pt agreeable to PT session.      Pertinent Vitals/Pain Pain Assessment: 0-10 Pain Score: 3  Pain Location: L ankle Pain Descriptors / Indicators: Aching Pain Intervention(s): Limited activity within patient's tolerance;Monitored during session;Premedicated before session;Repositioned    Home Living                      Prior Function            PT Goals (current goals can  now be found in the care plan section) Acute Rehab PT Goals Patient Stated Goal: go home and be able to get around PT Goal Formulation: With patient Time For Goal Achievement: 10/07/15 Potential to Achieve Goals: Good Progress towards PT goals: Progressing toward goals    Frequency  BID    PT Plan Current plan remains appropriate    Co-evaluation             End of Session Equipment Utilized During Treatment: Gait belt Activity Tolerance:  (Limited d/t significant HR increase with activity) Patient left: in bed;with call bell/phone within reach;with bed  alarm set (L LE elevated on pillows)     Time: [id number] PT Time Calculation (min) (ACUTE ONLY): 23 min  Charges:  $Gait Training: 8-22 mins $Therapeutic Activity: 8-22 mins                    G CodesLeitha Mcpherson Oct 02, 2015, 10:18 AM [name], [place]

## 2015-10-17 ENCOUNTER — Other Ambulatory Visit: Payer: Self-pay | Admitting: Physician Assistant

## 2015-10-21 NOTE — Telephone Encounter (Signed)
Pt seen in May by Allie Dimmer who did a rx strength change to Wellbutrin XL 150mg  and recommended f/u in one month at annual exam.  Pt came in for follow-up with Dr Hulan Fray in June and had no complaints, rx refilled for 5 months.  Received fax from [facility] for Wellbutrin XL 150mg .  Refill sent to pharmacy to provide her enough medication till she is due to come in for her annual and follow-up in June 2017.

## 2015-12-09 ENCOUNTER — Telehealth: Payer: Self-pay | Admitting: *Deleted

## 2015-12-09 DIAGNOSIS — N898 Other specified noninflammatory disorders of vagina: Secondary | ICD-10-CM

## 2015-12-09 MED ORDER — METRONIDAZOLE 500 MG PO TABS: 500.0000 mg | ORAL_TABLET | Freq: Two times a day (BID) | ORAL | Status: DC

## 2015-12-09 NOTE — Telephone Encounter (Signed)
Pt has been c/o a foul smelling discharge for a few weeks and urinary retention.  Had appt scheduled but is currently on her menstrual cycle.  Pt will come in to give urine specimen for urine culture and GC/CT of urine.  Will send in Flagyl for treatment of BV and call in any additional medications once we have the results as indicated.

## 2015-12-10 ENCOUNTER — Other Ambulatory Visit (INDEPENDENT_AMBULATORY_CARE_PROVIDER_SITE_OTHER): Payer: 59 | Admitting: *Deleted

## 2015-12-10 ENCOUNTER — Encounter: Payer: Self-pay | Admitting: *Deleted

## 2015-12-10 DIAGNOSIS — R300 Dysuria: Secondary | ICD-10-CM

## 2015-12-10 LAB — POCT URINALYSIS DIPSTICK
BILIRUBIN UA: NEGATIVE
Glucose, UA: NEGATIVE
KETONES UA: NEGATIVE
PH UA: 6.5
Protein, UA: NEGATIVE
SPEC GRAV UA: 1.01
Urobilinogen, UA: 0.2

## 2015-12-10 MED ORDER — FLAVOXATE HCL 100 MG PO TABS: 100.0000 mg | ORAL_TABLET | Freq: Three times a day (TID) | ORAL | Status: DC | PRN

## 2015-12-10 MED ORDER — CIPROFLOXACIN HCL 500 MG PO TABS: 500.0000 mg | ORAL_TABLET | Freq: Two times a day (BID) | ORAL | Status: DC

## 2015-12-10 NOTE — Progress Notes (Signed)
SUBJECTIVE: [name] is a 40 y.o. female who complains of urinary retention, vaginal odor, and dysuria x 16 days, without flank pain, fever, chills, or abnormal vaginal discharge. Currently on light menstrual cycle   OBJECTIVE: Appears well, in no apparent distress.  Vital signs are normal. Urine dipstick shows positive for leukocytes, nitrates, blood.    PLAN: Treatment per verbal orders - also push fluids. Ordered Wet Prep, Urine Culture, GC/C urine. Will call pt with test results.  Call or return to clinic prn if these symptoms worsen or fail to improve as anticipated.

## 2015-12-11 ENCOUNTER — Telehealth: Payer: Self-pay | Admitting: *Deleted

## 2015-12-11 LAB — GC/CHLAMYDIA PROBE AMP
CT PROBE, AMP APTIMA: NOT DETECTED
GC PROBE AMP APTIMA: NOT DETECTED

## 2015-12-11 LAB — WET PREP BY MOLECULAR PROBE
CANDIDA SPECIES: NEGATIVE
Gardnerella vaginalis: NEGATIVE
Trichomonas vaginosis: NEGATIVE

## 2015-12-11 NOTE — Telephone Encounter (Signed)
-----   Message from Francia Greaves sent at 12/11/2015  9:51 AM EDT ----- Regarding: Lab Results Contact: [phone] Lab results from yesterday's visit

## 2015-12-11 NOTE — Telephone Encounter (Signed)
Informed pt of results and that antibiotics had been sent to pharmacy and to start medication for UTI, urine cx pending, will call pt if we need to change treatment based on culture results.

## 2015-12-13 LAB — CULTURE, URINE COMPREHENSIVE: Colony Count: >=100000

## 2016-02-07 ENCOUNTER — Ambulatory Visit (INDEPENDENT_AMBULATORY_CARE_PROVIDER_SITE_OTHER): Payer: 59 | Admitting: Obstetrics & Gynecology

## 2016-02-07 ENCOUNTER — Encounter: Payer: Self-pay | Admitting: Obstetrics & Gynecology

## 2016-02-07 VITALS — BP 112/75 | HR 77 | Resp 18 | Ht 64.0 in | Wt 161.0 lb

## 2016-02-07 DIAGNOSIS — N9489 Other specified conditions associated with female genital organs and menstrual cycle: Secondary | ICD-10-CM

## 2016-02-07 DIAGNOSIS — N898 Other specified noninflammatory disorders of vagina: Secondary | ICD-10-CM

## 2016-02-07 NOTE — Progress Notes (Signed)
   Subjective:    Patient ID: [name], female    DOB: Jan 23, 1976, 40 y.o.   MRN: [id number]  HPI 40 yo monogamous W P4 here with the complaint of a vaginal odor for the last 4 days. She finished her period a few days ago. She was treated over the phone with flagyl in March for presumptive BV. She does not douche.  Review of Systems     Objective:   Physical Exam [id number] Breathing, conversing, and ambulating normally EG, vagina, cervix- all normal, very little discharge noted, no odor noted       Assessment & Plan:  Symptomatic vaginal odor- wet prep sent Rec start probiotics daily

## 2016-02-08 LAB — WET PREP, GENITAL
Trich, Wet Prep: NONE SEEN
Yeast Wet Prep HPF POC: NONE SEEN

## 2016-02-11 ENCOUNTER — Telehealth: Payer: Self-pay | Admitting: *Deleted

## 2016-02-11 DIAGNOSIS — N760 Acute vaginitis: Principal | ICD-10-CM

## 2016-02-11 DIAGNOSIS — B9689 Other specified bacterial agents as the cause of diseases classified elsewhere: Secondary | ICD-10-CM

## 2016-02-11 MED ORDER — METRONIDAZOLE 0.75 % VA GEL: 1.0000 | Freq: Every day | VAGINAL | Status: DC

## 2016-02-11 NOTE — Telephone Encounter (Signed)
Informed pt of wet prep result and send Metrogel to pharmacy, instructed pt on medication use.

## 2016-02-11 NOTE — Telephone Encounter (Signed)
-----   Message from Francia Greaves sent at 02/11/2016  4:37 PM EDT ----- Regarding: lab results Contact: [phone] Wants lab results

## 2016-03-13 ENCOUNTER — Encounter: Payer: Self-pay | Admitting: Obstetrics and Gynecology

## 2016-03-13 ENCOUNTER — Ambulatory Visit (INDEPENDENT_AMBULATORY_CARE_PROVIDER_SITE_OTHER): Payer: 59 | Admitting: Obstetrics and Gynecology

## 2016-03-13 VITALS — BP 116/79 | HR 81 | Ht 64.0 in | Wt 164.0 lb

## 2016-03-13 DIAGNOSIS — Z01419 Encounter for gynecological examination (general) (routine) without abnormal findings: Secondary | ICD-10-CM

## 2016-03-13 DIAGNOSIS — R300 Dysuria: Secondary | ICD-10-CM

## 2016-03-13 DIAGNOSIS — R6882 Decreased libido: Secondary | ICD-10-CM | POA: Diagnosis not present

## 2016-03-13 DIAGNOSIS — N898 Other specified noninflammatory disorders of vagina: Secondary | ICD-10-CM | POA: Diagnosis not present

## 2016-03-13 LAB — POCT URINALYSIS DIPSTICK
Bilirubin, UA: NEGATIVE
Blood, UA: NEGATIVE
Glucose, UA: NEGATIVE
Ketones, UA: NEGATIVE
Leukocytes, UA: NEGATIVE
Nitrite, UA: NEGATIVE
Protein, UA: NEGATIVE
Spec Grav, UA: 1.01
Urobilinogen, UA: NEGATIVE
pH, UA: 5

## 2016-03-13 MED ORDER — ESTRADIOL 0.1 MG/GM VA CREA: 0.2500 | TOPICAL_CREAM | Freq: Every day | VAGINAL | Status: DC

## 2016-03-13 NOTE — Progress Notes (Signed)
Obstetrics and Gynecology Visit Annual Exam  Appointment Date: 03/14/2016  Primary Care Provider: Golden Mcpherson  Referring Provider: Guadalupe Maple, MD  Chief Complaint:  Chief Complaint  Patient presents with  . Gynecologic Exam    History of Present Illness: [name] is a 40 y.o. Caucasian G5P4 (Patient's last menstrual period was 03/07/2016.), seen for the above chief complaint. Her past medical history is significant for anxiety, tobacco abuse.   Patient is doing well except she does endorse a 1-1.5 year history of decreased libido and vaginal dryness. Occasional hot flashes and night sweats. Still having regular, qmonth periods with no intermenstrual bleeding. She does state that her periods seem a little more painful and heavier during this time period, too. Vasectomy for contraception and they do use lubrication and it doesn't seem to help that much.  No particular life stressors identified and their relationship seems good  No fevers, chills, chest pain, SOB, nausea, vomiting, dysuria, hematuria, vaginal discharge, vaginal itching, dyspareunia, diarrhea, blood in BMs, abdominal pain, breast s/s.   Review of Systems: Her 12 point review of systems is negative or as noted in the History of Present Illness.  Past Medical History:  Past Medical History  Diagnosis Date  . History of tobacco abuse   . Pyelonephritis   . Ovarian cyst   . Interstitial cystitis   . Panic disorder   . Endometriosis   . Sinus problem     Right maxillary (frequent)  . MRSA (methicillin resistant staph aureus) culture positive 2013  . Medical history non-contributory   . Arrhythmia     Bradycardia  . Headache   . PYELONEPHRITIS 06/08/2009    Qualifier: Diagnosis of  By: [name]      Past Surgical History:  Past Surgical History  Procedure Laterality Date  . Appendectomy    . Laparoscopy    . Orif ankle fracture Left 09/29/2015    Procedure: OPEN REDUCTION INTERNAL FIXATION  (ORIF) ANKLE FRACTURE;  Surgeon: Sarah Knows, MD;  Location: [facility] ORS;  Service: Orthopedics;  Laterality: Left;    Past Obstetrical History:  OB History    Gravida Para Term Preterm AB TAB SAB Ectopic Multiple Living   5 4        4       SVD x 4  Past Gynecological History: As per HPI. No. history of abnormal pap smears (last pap smear: 2016, which was negative and HPV negative) No. history of STIs.   She is currently using vasectomy for contraception.  No. HRT use.   Social History:  Social History   Social History  . Marital Status: Divorced    Spouse Name: N/A  . Number of Children: 2  . Years of Education: N/A   Occupational History  . Dental technician    Social History Main Topics  . Smoking status: Current Every Day Smoker -- 0.50 packs/day for 11 years    Types: Cigarettes  . Smokeless tobacco: Never Used  . Alcohol Use: 2.4 oz/week    4 Glasses of wine per week  . Drug Use: No  . Sexual Activity:    Partners: Male    Museum/gallery curator: , Surgical     Comment: vasectomy   Other Topics Concern  . Not on file   Social History Narrative   Started smoking related to the tension of her first marriage and 2 children.    Family History:  Family History  Problem Relation Age of Onset  .  Cancer Neg Hx     No obvious cancer  . Leukemia Paternal Grandfather   . Thyroid disease Maternal Aunt    She denies any female cancers, bleeding or blood clotting disorders.   Health Maintenance:  Mammogram: no  Medications [name] had no medications administered during this visit. Current Outpatient Prescriptions  Medication Sig Dispense Refill  . buPROPion (WELLBUTRIN XL) 150 MG 24 hr tablet TAKE 1 TABLET(150 MG) BY MOUTH DAILY 30 tablet 5  . ibuprofen (ADVIL,MOTRIN) 200 MG tablet Take 200 mg by mouth as needed.      . rizatriptan (MAXALT) 10 MG tablet Take 1 tablet (10 mg total) by mouth once as needed for migraine. May repeat in 2 hours if needed 12  tablet 11  . zolpidem (AMBIEN) 10 MG tablet Take 1 tablet (10 mg total) by mouth at bedtime as needed for sleep. 30 tablet 3  . estradiol (ESTRACE) 0.1 MG/GM vaginal cream Place [id number] Applicatorfuls vaginally at bedtime. 1/4 applicator vaginally qhs x 2 wks and then 1/4 applicator vaginally qhs 2x/week 42.5 g 12  . metoprolol tartrate (LOPRESSOR) 25 MG tablet Take 1 tablet (25 mg total) by mouth 2 (two) times daily as needed (tachycardia). (Patient not taking: Reported on 02/07/2016) 60 tablet 3  . propranolol (INDERAL) 20 MG tablet Take 1 tablet (20 mg total) by mouth 3 (three) times daily as needed (tachycardia). (Patient not taking: Reported on 02/07/2016) 90 tablet 4   No current facility-administered medications for this visit.    Allergies Other; Penicillins; and Sulfonamide derivatives   Physical Exam:  BP 116/79 mmHg  Pulse 81  Ht 5\' 4"  (1.626 m)  Wt 164 lb (74.39 kg)  BMI 28.14 kg/m2  LMP 03/07/2016 Body mass index is 28.14 kg/(m^2). General appearance: Well nourished, well developed female in no acute distress.  Neck:  Supple, normal appearance, and no thyromegaly  Cardiovascular: normal s1 and s2.  No murmurs, rubs or gallops. Respiratory:  Clear to auscultation bilateral. Normal respiratory effort Abdomen: positive bowel sounds and no masses, hernias; diffusely non tender to palpation, non distended Breasts: breasts appear normal, no suspicious masses, no skin or nipple changes or axillary nodes, and negative palpation. Neuro/Psych:  Normal mood and affect.  Skin:  Warm and dry.  Lymphatic:  No inguinal lymphadenopathy.   Pelvic exam: is not limited by body habitus EGBUS: within normal limits Vagina: within normal limits and with no blood in the vault; appears well estrogenized, Cervix:  no lesions or cervical motion tenderness Uterus:  nonenlarged and approximately 8 week sized, non tender Adnexa:  normal adnexa Rectovaginal: deferred  Laboratory: poc u/a  negative  Radiology: none  Assessment: normal GYN exam, +decreased libido and vaginal dryness  Plan:  I d/w her to try silicone based lubrication and to try and identify any extrinsic factors.  Her change in period and s/s may be due to a decrease in ovarian function and although her pelvic exam is normal I brought trying some topic estrogen if the silicone based one doesn't work. R/b/a d/w her, including trying counseling/therapy possible increased risk of cancers, VTE, CVA, MI, etc with use and she would like to try it.  Estrace quarter applicator qhs x 2wks and then 2-3x/wk. Will RTC in 2-59m for follow up.   Patient would like to start her mammogram screening  Orders Placed This Encounter  Procedures  . MM DIGITAL SCREENING BILATERAL  . POCT Urinalysis Dipstick    Durene Romans MD Attending Center for [facility]  [facility] [employer] farm manager)

## 2016-03-13 NOTE — Progress Notes (Signed)
Here today for annual exam with pap and labwork.  Patient is requesting a refill on her Ambien.  Complaining of decreased libido.

## 2016-03-14 ENCOUNTER — Encounter: Payer: Self-pay | Admitting: Obstetrics and Gynecology

## 2016-03-14 DIAGNOSIS — N898 Other specified noninflammatory disorders of vagina: Secondary | ICD-10-CM | POA: Insufficient documentation

## 2016-04-03 ENCOUNTER — Other Ambulatory Visit: Payer: Self-pay | Admitting: Obstetrics and Gynecology

## 2016-04-03 ENCOUNTER — Ambulatory Visit
Admission: RE | Admit: 2016-04-03 | Discharge: 2016-04-03 | Disposition: A | Discharge status: Home / Self Care | Payer: 59 | Source: Ambulatory Visit | Attending: Obstetrics and Gynecology | Admitting: Obstetrics and Gynecology

## 2016-04-03 DIAGNOSIS — Z01419 Encounter for gynecological examination (general) (routine) without abnormal findings: Secondary | ICD-10-CM

## 2016-04-03 DIAGNOSIS — Z1231 Encounter for screening mammogram for malignant neoplasm of breast: Secondary | ICD-10-CM | POA: Insufficient documentation

## 2016-04-22 ENCOUNTER — Ambulatory Visit: Payer: 59 | Admitting: Obstetrics and Gynecology

## 2016-07-27 ENCOUNTER — Telehealth: Payer: Self-pay | Admitting: *Deleted

## 2016-07-27 ENCOUNTER — Ambulatory Visit (INDEPENDENT_AMBULATORY_CARE_PROVIDER_SITE_OTHER): Payer: 59 | Admitting: Family Medicine

## 2016-07-27 ENCOUNTER — Encounter: Payer: Self-pay | Admitting: Family Medicine

## 2016-07-27 VITALS — BP 128/81 | HR 102 | Resp 18 | Ht 64.0 in | Wt 163.0 lb

## 2016-07-27 DIAGNOSIS — N3289 Other specified disorders of bladder: Secondary | ICD-10-CM

## 2016-07-27 DIAGNOSIS — N898 Other specified noninflammatory disorders of vagina: Secondary | ICD-10-CM

## 2016-07-27 LAB — POCT URINALYSIS DIPSTICK
Bilirubin, UA: NEGATIVE
Glucose, UA: NEGATIVE
Ketones, UA: NEGATIVE
Nitrite, UA: NEGATIVE
PROTEIN UA: NEGATIVE
RBC UA: NEGATIVE
SPEC GRAV UA: 1.01
UROBILINOGEN UA: 0.2
pH, UA: 6.5

## 2016-07-27 MED ORDER — HYLAFEM VA SUPP: 1.0000 | Freq: Every evening | VAGINAL | 0 refills | Status: DC | PRN

## 2016-07-27 MED ORDER — FLAVOXATE HCL 100 MG PO TABS: 100.0000 mg | ORAL_TABLET | Freq: Three times a day (TID) | ORAL | Status: DC | PRN

## 2016-07-27 MED ORDER — FLAVOXATE HCL 100 MG PO TABS: 100.0000 mg | ORAL_TABLET | Freq: Three times a day (TID) | ORAL | 1 refills | Status: DC | PRN

## 2016-07-27 NOTE — Telephone Encounter (Signed)
Pt went to pick up Urispas, medication was not at pharmacy.  Dr Kennon Rounds had accidentally placed order as facility administered, corrected and sent to pharmacy.

## 2016-07-27 NOTE — Patient Instructions (Signed)

## 2016-07-27 NOTE — Progress Notes (Signed)
   Subjective:    Patient ID: [name] is a 40 y.o. female presenting with Vaginal Odor  on 07/27/2016  HPI: Reports bladder spasm and vaginal odor, ? If she left a tampon in there. Has been present x 4 days.  Previously had a BV that was treated and this improved. She has taken some probiotics but none recently. Does not take baths, except for epsom salt bath recently. Reports sulfa water. Bladder spasms following urination.  Review of Systems  Constitutional: Negative for chills and fever.  Respiratory: Negative for shortness of breath.   Cardiovascular: Negative for chest pain.  Gastrointestinal: Negative for abdominal pain, nausea and vomiting.  Genitourinary: Negative for dysuria.  Skin: Negative for rash.      Objective:    BP 128/81 (BP Location: Left Arm, Patient Position: Sitting, Cuff Size: Normal)   Pulse (!) 102   Resp 18   Ht 5\' 4"  (1.626 m)   Wt 163 lb (73.9 kg)   LMP 06/29/2016   BMI 27.98 kg/m  Physical Exam  Constitutional: She is oriented to person, place, and time. She appears well-developed and well-nourished. No distress.  HENT:  Head: Normocephalic and atraumatic.  Eyes: No scleral icterus.  Neck: Neck supple.  Cardiovascular: Normal rate.   Pulmonary/Chest: Effort normal.  Abdominal: Soft.  Genitourinary:  Genitourinary Comments: BUS normal, vagina is pink and rugated, cervix is nulliparous without lesion, uterus is small and anteverted, no adnexal mass or tenderness. Blood in vault. No foreign body. No odor. No discharge.   Neurological: She is alert and oriented to person, place, and time.  Skin: Skin is warm and dry.  Psychiatric: She has a normal mood and affect.   Urinalysis    Component Value Date/Time   COLORURINE YELLOW 06/22/2008 [place] 06/22/2008 1245   LABSPEC <1.005 (L) 06/22/2008 1245   PHURINE 6.5 06/22/2008 1245   GLUCOSEU NEGATIVE 06/22/2008 1245   HGBUR NEGATIVE 06/22/2008 1245   BILIRUBINUR  Negative 07/27/2016 1332   KETONESUR NEGATIVE 06/22/2008 1245   PROTEINUR Negative 07/27/2016 1332   PROTEINUR NEGATIVE 06/22/2008 1245   UROBILINOGEN 0.2 07/27/2016 1332   UROBILINOGEN 0.2 06/22/2008 1245   NITRITE Negative 07/27/2016 1332   NITRITE NEGATIVE 06/22/2008 1245   LEUKOCYTESUR Trace (A) 07/27/2016 1332        Assessment & Plan:  Bladder spasms - trial of urispas--send culture - Plan: POCT Urinalysis Dipstick, Urine culture, flavoxATE (URISPAS) tablet 100 mg  Vaginal discharge - trial of Hylafem--check wet prep and treat appropriately. Resume probiotics. - Plan: Wet prep, genital, Homeopathic Products (HYLAFEM) SUPP   Total face-to-face time with patient: 15 minutes. Over 50% of encounter was spent on counseling and coordination of care. Return if symptoms worsen or fail to improve.  Donnamae Jude 07/27/2016 1:29 PM

## 2016-07-28 LAB — WET PREP, GENITAL
CLUE CELLS WET PREP: NONE SEEN
Trich, Wet Prep: NONE SEEN
WBC WET PREP: NONE SEEN
Yeast Wet Prep HPF POC: NONE SEEN

## 2016-07-29 ENCOUNTER — Telehealth: Payer: Self-pay | Admitting: *Deleted

## 2016-07-29 DIAGNOSIS — N390 Urinary tract infection, site not specified: Principal | ICD-10-CM

## 2016-07-29 DIAGNOSIS — B9689 Other specified bacterial agents as the cause of diseases classified elsewhere: Secondary | ICD-10-CM

## 2016-07-29 LAB — URINE CULTURE

## 2016-07-29 NOTE — Telephone Encounter (Signed)
-----   Message from Donnamae Jude, MD sent at 07/29/2016  4:44 PM EST ----- Has UTI--treat with Septra DS I po bid x 3 d

## 2016-07-30 MED ORDER — CIPROFLOXACIN HCL 500 MG PO TABS: 500.0000 mg | ORAL_TABLET | Freq: Two times a day (BID) | ORAL | 0 refills | Status: DC

## 2016-07-30 NOTE — Telephone Encounter (Deleted)
-----   Message from Donnamae Jude, MD sent at 07/29/2016  4:44 PM EST ----- Has UTI--treat with Septra DS I po bid x 3 d

## 2016-07-30 NOTE — Telephone Encounter (Signed)
Pt is allergic to sulfa, sent in Cipro to pharmacy based off urine cx result and sensitivity.  Informed pt of result and instructed pt on medication use.

## 2016-10-24 ENCOUNTER — Emergency Department
Admission: EM | Admit: 2016-10-24 | Discharge: 2016-10-26 | Disposition: A | Discharge status: Other Acute Inpatient Hospital | Payer: 59 | Attending: Emergency Medicine | Admitting: Emergency Medicine

## 2016-10-24 DIAGNOSIS — R45851 Suicidal ideations: Secondary | ICD-10-CM | POA: Diagnosis present

## 2016-10-24 DIAGNOSIS — F332 Major depressive disorder, recurrent severe without psychotic features: Secondary | ICD-10-CM | POA: Insufficient documentation

## 2016-10-24 DIAGNOSIS — Z79899 Other long term (current) drug therapy: Secondary | ICD-10-CM | POA: Diagnosis not present

## 2016-10-24 DIAGNOSIS — F17210 Nicotine dependence, cigarettes, uncomplicated: Secondary | ICD-10-CM | POA: Insufficient documentation

## 2016-10-24 LAB — CBC
HCT: 40.6 % (ref 35.0–47.0)
Hemoglobin: 13.6 g/dL (ref 12.0–16.0)
MCH: 32.9 pg (ref 26.0–34.0)
MCHC: 33.6 g/dL (ref 32.0–36.0)
MCV: 98.2 fL (ref 80.0–100.0)
PLATELETS: 209 10*3/uL (ref 150–440)
RBC: 4.13 MIL/uL (ref 3.80–5.20)
RDW: 12.6 % (ref 11.5–14.5)
WBC: 5.2 10*3/uL (ref 3.6–11.0)

## 2016-10-24 LAB — COMPREHENSIVE METABOLIC PANEL
ALBUMIN: 4.1 g/dL (ref 3.5–5.0)
ALK PHOS: 50 U/L (ref 38–126)
ALT: 16 U/L (ref 14–54)
AST: 18 U/L (ref 15–41)
Anion gap: 9 (ref 5–15)
BILIRUBIN TOTAL: 0.3 mg/dL (ref 0.3–1.2)
BUN: 14 mg/dL (ref 6–20)
CO2: 23 mmol/L (ref 22–32)
CREATININE: 0.79 mg/dL (ref 0.44–1.00)
Calcium: 8.4 mg/dL — ABNORMAL LOW (ref 8.9–10.3)
Chloride: 109 mmol/L (ref 101–111)
GFR calc Af Amer: >60 mL/min (ref >60)
GLUCOSE: 91 mg/dL (ref 65–99)
Potassium: 4.4 mmol/L (ref 3.5–5.1)
Sodium: 141 mmol/L (ref 135–145)
TOTAL PROTEIN: 6.7 g/dL (ref 6.5–8.1)

## 2016-10-24 LAB — ACETAMINOPHEN LEVEL: Acetaminophen (Tylenol), Serum: <10 ug/mL — ABNORMAL LOW (ref 10–30)

## 2016-10-24 LAB — URINE DRUG SCREEN, QUALITATIVE (ARMC ONLY)
Amphetamines, Ur Screen: NOT DETECTED
BARBITURATES, UR SCREEN: NOT DETECTED
Benzodiazepine, Ur Scrn: NOT DETECTED
CANNABINOID 50 NG, UR ~~LOC~~: NOT DETECTED
COCAINE METABOLITE, UR ~~LOC~~: NOT DETECTED
MDMA (ECSTASY) UR SCREEN: NOT DETECTED
Methadone Scn, Ur: NOT DETECTED
Opiate, Ur Screen: NOT DETECTED
PHENCYCLIDINE (PCP) UR S: NOT DETECTED
Tricyclic, Ur Screen: NOT DETECTED

## 2016-10-24 LAB — ETHANOL: ALCOHOL ETHYL (B): 196 mg/dL — AB (ref <5)

## 2016-10-24 LAB — SALICYLATE LEVEL

## 2016-10-24 LAB — POCT PREGNANCY, URINE: PREG TEST UR: NEGATIVE

## 2016-10-24 NOTE — ED Notes (Signed)
Attempted to removed silver colored ring from right ring finger, but unsuccessful due to swelling of finger.

## 2016-10-24 NOTE — ED Notes (Signed)
Pt reports she has hx of depression and has not been on her medications for 6 to 8 months. Pt also reports having issues with her boyfriend as he is cheating on her and tonight things just "blew up" and she threatened to put a belt around her neck and kill herself. Pt states she does not currently wish to harm herself but has had "vague" thoughts in the past.

## 2016-10-24 NOTE — ED Triage Notes (Signed)
Patient to ED with [facility] under IVC.  When ask if knew why she was her patient stated she had made a statement about wanting to harm herself.

## 2016-10-24 NOTE — ED Provider Notes (Signed)
[facility] Emergency Department Provider Note  ____________________________________________  Time seen: Approximately 11:32 PM  I have reviewed the triage vital signs and the nursing notes.   HISTORY  Chief Complaint Psychiatric Evaluation   HPI [name] is a 41 y.o. female history of depression who presents for evaluation of suicidal ideation. Patient was IVC by police after she try to kill herself by putting a belt around her neck. Patient reports that she has been in a very bad relationship with her boyfriend of 15 years. She has 2 kids with him. She caught him cheating on her. She reports that she's been depressed for a very long time. She has been having passive suicidal thoughts. Today she finally discovered he was having an affair which made her tried to kill herself. She has 4 kids and deneis any prior suicide attempts. She denies drugs or alcohol.  Past Medical History:  Diagnosis Date  . Arrhythmia    Bradycardia  . Endometriosis   . Headache   . History of tobacco abuse   . Interstitial cystitis   . Medical history non-contributory   . MRSA (methicillin resistant staph aureus) culture positive 2013  . Ovarian cyst   . Panic disorder   . Pyelonephritis   . PYELONEPHRITIS 06/08/2009   Qualifier: Diagnosis of  By: Sidney Ace    . Sinus problem    Right maxillary (frequent)    Patient Active Problem List   Diagnosis Date Noted  . Vaginal dryness 03/14/2016  . Trimalleolar fracture of ankle, closed 09/28/2015  . Atrial tachycardia ([place]) 08/09/2015  . Atrial tachycardia, paroxysmal ([place]) 01/05/2014  . Obesity (BMI 30-39.9) 12/19/2013  . Migraine without aura 12/29/2011  . Anxiety 12/29/2011  . Insomnia 12/29/2011  . CIGARETTE SMOKER 10/08/2010  . BRADYCARDIA 06/12/2009  . CAROTID BRUIT 06/12/2009  . INTERSTITIAL CYSTITIS 06/08/2009  . ENDOMETRIOSIS 06/08/2009  . PANIC DISORDER, HX OF 06/08/2009  . TOBACCO ABUSE, HX OF  06/08/2009    Past Surgical History:  Procedure Laterality Date  . APPENDECTOMY    . LAPAROSCOPY    . ORIF ANKLE FRACTURE Left 09/29/2015   Procedure: OPEN REDUCTION INTERNAL FIXATION (ORIF) ANKLE FRACTURE;  Surgeon: Hessie Knows, MD;  Location: [facility] ORS;  Service: Orthopedics;  Laterality: Left;    Prior to Admission medications   Medication Sig Start Date End Date Taking? Authorizing Provider  ibuprofen (ADVIL,MOTRIN) 200 MG tablet Take 200 mg by mouth as needed.     Yes Historical Provider, MD  loratadine-pseudoephedrine (CLARITIN-D 24-HOUR) 10-240 MG 24 hr tablet Take 1 tablet by mouth daily.   Yes Historical Provider, MD  metoprolol tartrate (LOPRESSOR) 25 MG tablet Take 25 mg by mouth 2 (two) times daily. As needed   Yes Historical Provider, MD  Multiple Vitamin (MULTIVITAMIN WITH MINERALS) TABS tablet Take 1 tablet by mouth daily.   Yes Historical Provider, MD  propranolol (INDERAL) 20 MG tablet Take 1 tablet (20 mg total) by mouth 3 (three) times daily as needed (tachycardia). 08/09/15  Yes Minna Merritts, MD  PROVENTIL HFA 108 (90 Base) MCG/ACT inhaler Inhale 2 puffs into the lungs every 4 (four) hours as needed. 10/01/16  Yes Historical Provider, MD  buPROPion (WELLBUTRIN XL) 150 MG 24 hr tablet Take 150 mg by mouth daily.    Historical Provider, MD    Allergies Sulfonamide derivatives; Other; and Penicillins  Family History  Problem Relation Age of Onset  . Leukemia Paternal Grandfather   . Thyroid disease Maternal Aunt   .  Cancer Neg Hx     No obvious cancer  . Breast cancer Neg Hx     Social History Social History  Substance Use Topics  . Smoking status: Current Every Day Smoker    Packs/day: 0.50    Years: 11.00    Types: Cigarettes  . Smokeless tobacco: Never Used  . Alcohol use 2.4 oz/week    4 Glasses of wine per week    Review of Systems  Constitutional: Negative for fever. Eyes: Negative for visual changes. ENT: Negative for sore throat. Neck: No  neck pain  Cardiovascular: Negative for chest pain. Respiratory: Negative for shortness of breath. Gastrointestinal: Negative for abdominal pain, vomiting or diarrhea. Genitourinary: Negative for dysuria. Musculoskeletal: Negative for back pain. Skin: Negative for rash. Neurological: Negative for headaches, weakness or numbness. Psych: +SI. No HI  ____________________________________________   PHYSICAL EXAM:  VITAL SIGNS: ED Triage Vitals  Enc Vitals Group     BP --      Pulse Rate 10/24/16 2137 (!) 117     Resp 10/24/16 2137 20     Temp 10/24/16 2137 97.6 F (36.4 C)     Temp Source 10/24/16 2137 Oral     SpO2 10/24/16 2137 100 %     Weight 10/24/16 2133 160 lb (72.6 kg)     Height 10/24/16 2133 5\' 4"  (1.626 m)     Head Circumference --      Peak Flow --      Pain Score --      Pain Loc --      Pain Edu? --      Excl. in [place]? --     Constitutional: Alert and oriented, teary.  HEENT:      Head: Normocephalic and atraumatic.         Eyes: Conjunctivae are normal. Sclera is non-icteric. EOMI. PERRL      Mouth/Throat: Mucous membranes are moist.       Neck: Supple with no signs of meningismus. Cardiovascular: Tachycardic with regular rhythm. No murmurs, gallops, or rubs. 2+ symmetrical distal pulses are present in all extremities. No JVD. Respiratory: Normal respiratory effort. Lungs are clear to auscultation bilaterally. No wheezes, crackles, or rhonchi.  Gastrointestinal: Soft, non tender, and non distended with positive bowel sounds. No rebound or guarding. Musculoskeletal: Nontender with normal range of motion in all extremities. No edema, cyanosis, or erythema of extremities. Neurologic: Normal speech and language. Face is symmetric. Moving all extremities. No gross focal neurologic deficits are appreciated. Skin: Skin is warm, dry and intact. No rash noted. Psychiatric: Mood and affect are depressed. Speech and behavior are normal. SI with no  plan  ____________________________________________   LABS (all labs ordered are listed, but only abnormal results are displayed)  Labs Reviewed  COMPREHENSIVE METABOLIC PANEL - Abnormal; Notable for the following:       Result Value   Calcium 8.4 (*)    All other components within normal limits  CBC  URINE DRUG SCREEN, QUALITATIVE ([facility] ONLY)  ETHANOL  SALICYLATE LEVEL  ACETAMINOPHEN LEVEL  POCT PREGNANCY, URINE   ____________________________________________  EKG  none ____________________________________________  RADIOLOGY  none  ____________________________________________   PROCEDURES  Procedure(s) performed: None Procedures Critical Care performed:  None ____________________________________________   INITIAL IMPRESSION / ASSESSMENT AND PLAN / ED COURSE  41 y.o. female history of depression who presents for evaluation of suicidal ideation. IVC placed. Medically cleared. Psych and TTS consulted.     Pertinent labs & imaging results that were available  during my care of the patient were reviewed by me and considered in my medical decision making (see chart for details).    ____________________________________________   FINAL CLINICAL IMPRESSION(S) / ED DIAGNOSES  Final diagnoses:  Severe episode of recurrent major depressive disorder, without psychotic features ([place])  Suicidal ideation      NEW MEDICATIONS STARTED DURING THIS VISIT:  New Prescriptions   No medications on file     Note:  This document was prepared using Dragon voice recognition software and may include unintentional dictation errors.     Rudene Re, MD 10/24/16 [phone]

## 2016-10-24 NOTE — ED Notes (Signed)
Attempted to draw blood from left antecub

## 2016-10-24 NOTE — ED Notes (Signed)
BEHAVIORAL HEALTH ROUNDING  Patient sleeping: No.  Patient alert and oriented: yes  Behavior appropriate: Yes. ; If no, describe:  Nutrition and fluids offered: Yes  Toileting and hygiene offered: Yes  Sitter present: not applicable, Q 15 min safety rounds and observation.  Law enforcement present: Yes ODS  

## 2016-10-25 MED ORDER — METOPROLOL TARTRATE 25 MG PO TABS
25.0000 mg | ORAL_TABLET | Freq: Two times a day (BID) | ORAL | Status: DC
  Administered 2016-10-25 (×3): 25 mg via ORAL
  Filled 2016-10-25 (×3): qty 1

## 2016-10-25 MED ORDER — LORAZEPAM 1 MG PO TABS
ORAL_TABLET | ORAL | Status: AC
  Filled 2016-10-25: qty 1

## 2016-10-25 MED ORDER — NICOTINE POLACRILEX 2 MG MT GUM
2.0000 mg | CHEWING_GUM | OROMUCOSAL | Status: DC | PRN
  Administered 2016-10-25: 2 mg via ORAL

## 2016-10-25 MED ORDER — NICOTINE POLACRILEX 2 MG MT GUM
CHEWING_GUM | OROMUCOSAL | Status: AC
  Filled 2016-10-25: qty 1

## 2016-10-25 MED ORDER — PROPRANOLOL HCL 10 MG PO TABS
20.0000 mg | ORAL_TABLET | Freq: Three times a day (TID) | ORAL | Status: DC | PRN
  Filled 2016-10-25: qty 1
  Filled 2016-10-25: qty 2

## 2016-10-25 MED ORDER — ALBUTEROL SULFATE HFA 108 (90 BASE) MCG/ACT IN AERS
2.0000 | INHALATION_SPRAY | RESPIRATORY_TRACT | Status: DC | PRN
  Filled 2016-10-25: qty 6.7

## 2016-10-25 MED ORDER — LORAZEPAM 1 MG PO TABS
1.0000 mg | ORAL_TABLET | Freq: Once | ORAL | Status: AC
  Administered 2016-10-25: 1 mg via ORAL
  Filled 2016-10-25: qty 1

## 2016-10-25 MED ORDER — LORAZEPAM 1 MG PO TABS
1.0000 mg | ORAL_TABLET | Freq: Once | ORAL | Status: AC
  Administered 2016-10-25: 1 mg via ORAL

## 2016-10-25 MED ORDER — BUPROPION HCL ER (XL) 150 MG PO TB24
150.0000 mg | ORAL_TABLET | Freq: Every day | ORAL | Status: DC
  Administered 2016-10-25: 150 mg via ORAL
  Filled 2016-10-25: qty 1

## 2016-10-25 NOTE — ED Notes (Signed)
BEHAVIORAL HEALTH ROUNDING Patient sleeping: Yes.   Patient alert and oriented: not applicable SLEEPING Behavior appropriate: Yes.  ; If no, describe: SLEEPING Nutrition and fluids offered: No SLEEPING Toileting and hygiene offered: NoSLEEPING Sitter present: not applicable, Q 15 min safety rounds and observation. Law enforcement present: Yes ODS 

## 2016-10-25 NOTE — ED Notes (Signed)
BEHAVIORAL HEALTH ROUNDING  Patient sleeping: No.  Patient alert and oriented: yes  Behavior appropriate: Yes. ; If no, describe:  Nutrition and fluids offered: Yes  Toileting and hygiene offered: Yes  Sitter present: not applicable, Q 15 min safety rounds and observation.  Law enforcement present: Yes ODS  

## 2016-10-25 NOTE — ED Notes (Signed)
Pt awake asking to use phone, she is tearful and wants to go home ,pt explained ivc process and that  She is to be admitted

## 2016-10-25 NOTE — ED Notes (Signed)
Report was received from Guadelupe Sabin., RN; Pt. Verbalizes  complaints  Having depression; anxiety with distress relationship issues; verbalizes having S.I.; with having put a belt around her neck; denies having Hi. Continue to monitor with 15 min. Monitoring.

## 2016-10-25 NOTE — ED Notes (Signed)
Pt is much calmer after meds she states she has a nap and she feels less anxious,she appears in no distress,it was ex[pliained to her that she will go to her room after next shift

## 2016-10-25 NOTE — ED Notes (Signed)
Report was received Deborah Chalk., RN; Pt. Verbalizes no complaints or distress; denies S.I./Hi. Patient stating, "I hope that things we talked about get better by the time I'm discharged." Continue to monitor with 15 min. Monitoring.

## 2016-10-25 NOTE — ED Notes (Signed)
Family room

## 2016-10-25 NOTE — ED Notes (Signed)

## 2016-10-25 NOTE — ED Notes (Signed)
Report called to Joycelyn Schmid, RN in ED BHU. Dr Quentin Cornwall informed of patients hx of tachycardia and new orders received. Pt informed of move and is calm and cooperative.

## 2016-10-25 NOTE — BH Assessment (Signed)
Assessment Note  [name] is an 41 y.o. female with a history of depression, presenting to the ED under IVC for suicidal with intent.  Patient was IVC by police after she tried to kill herself by putting a belt around her neck. Patient reports that she has been in a very bad relationship with her boyfriend of 15 years.  She states she caught him cheating on her with a family member. She reports that she's been depressed for a very long time. She denies any previous SI attempts or psychiatric hospitalizations.  She denies HI and any auditory/visual hallucinations.  She admits to drinking alcohol on a regular basis.  Diagnosis: dEPRESSION  Past Medical History:  Past Medical History:  Diagnosis Date  . Arrhythmia    Bradycardia  . Endometriosis   . Headache   . History of tobacco abuse   . Interstitial cystitis   . Medical history non-contributory   . MRSA (methicillin resistant staph aureus) culture positive 2013  . Ovarian cyst   . Panic disorder   . Pyelonephritis   . PYELONEPHRITIS 06/08/2009   Qualifier: Diagnosis of  By: Sidney Ace    . Sinus problem    Right maxillary (frequent)    Past Surgical History:  Procedure Laterality Date  . APPENDECTOMY    . LAPAROSCOPY    . ORIF ANKLE FRACTURE Left 09/29/2015   Procedure: OPEN REDUCTION INTERNAL FIXATION (ORIF) ANKLE FRACTURE;  Surgeon: Hessie Knows, MD;  Location: [facility] ORS;  Service: Orthopedics;  Laterality: Left;    Family History:  Family History  Problem Relation Age of Onset  . Leukemia Paternal Grandfather   . Thyroid disease Maternal Aunt   . Cancer Neg Hx     No obvious cancer  . Breast cancer Neg Hx     Social History:  reports that she has been smoking Cigarettes.  She has a 5.50 pack-year smoking history. She has never used smokeless tobacco. She reports that she drinks about 2.4 oz of alcohol per week . She reports that she does not use drugs.  Additional Social History:  Alcohol / Drug Use Pain  Medications: See PTA Prescriptions: See PTA Over the Counter: See PTA History of alcohol / drug use?: No history of alcohol / drug abuse  CIWA: CIWA-Ar BP: 120/75 Pulse Rate: (!) 121 COWS:    Allergies:  Allergies  Allergen Reactions  . Sulfonamide Derivatives Anaphylaxis  . Other     Red food Coloring  . Penicillins Other (See Comments)    Unknown reactions from PT    Home Medications:  (Not in a hospital admission)  OB/GYN Status:  Patient's last menstrual period was 10/03/2016 (approximate).  General Assessment Data Location of Assessment: [facility] ED TTS Assessment: In system Is this a Tele or Face-to-Face Assessment?: Face-to-Face Is this an Initial Assessment or a Re-assessment for this encounter?: Initial Assessment Marital status: Married [place] name: n/a Is patient pregnant?: No Pregnancy Status: No Living Arrangements: Children Can pt return to current living arrangement?: Yes Admission Status: Involuntary Is patient capable of signing voluntary admission?: Yes Referral Source: Self/Family/Friend Insurance type: [place] Living Arrangements: Children Legal Guardian: Other: (self) Name of Psychiatrist: none reported Name of Therapist: none reported  Education Status Is patient currently in school?: No Current Grade: na Highest grade of school patient has completed: college Name of school: na Contact person: na  Risk to self with the past 6 months Suicidal Ideation: Yes-Currently Present Has patient  been a risk to self within the past 6 months prior to admission? : No Suicidal Intent: Yes-Currently Present Has patient had any suicidal intent within the past 6 months prior to admission? : No Is patient at risk for suicide?: Yes Suicidal Plan?: Yes-Currently Present Has patient had any suicidal plan within the past 6 months prior to admission? : No Specify Current Suicidal Plan: Pt tried to hang herself with a belt Access to Means:  Yes Specify Access to Suicidal Means: Pt had access to belt What has been your use of drugs/alcohol within the last 12 months?: alcohol Previous Attempts/Gestures: No How many times?: 0 Other Self Harm Risks: none reported Triggers for Past Attempts: None known Intentional Self Injurious Behavior: None Family Suicide History: No Recent stressful life event(s): Divorce, Conflict (Comment) Persecutory voices/beliefs?: No Depression: Yes Depression Symptoms: Tearfulness, Despondent Substance abuse history and/or treatment for substance abuse?: No Suicide prevention information given to non-admitted patients: Not applicable  Risk to Others within the past 6 months Homicidal Ideation: No Does patient have any lifetime risk of violence toward others beyond the six months prior to admission? : No Thoughts of Harm to Others: No Current Homicidal Intent: No Current Homicidal Plan: No Access to Homicidal Means: No Identified Victim: none identified History of harm to others?: No Assessment of Violence: None Noted Does patient have access to weapons?: No Criminal Charges Pending?: No Does patient have a court date: No Is patient on probation?: No  Psychosis Hallucinations: None noted Delusions: None noted  Mental Status Report Appearance/Hygiene: In scrubs Eye Contact: Good Motor Activity: Freedom of movement, Unremarkable Speech: Logical/coherent Level of Consciousness: Drowsy, Crying Mood: Depressed, Sad Affect: Appropriate to circumstance, Sad, Depressed Anxiety Level: None Thought Processes: Coherent, Relevant Judgement: Partial Orientation: Person, Place, Time, Situation Obsessive Compulsive Thoughts/Behaviors: None  Cognitive Functioning Concentration: Normal Memory: Recent Intact, Remote Intact IQ: Average Insight: Poor Impulse Control: Poor Appetite: Good Weight Loss: 0 Weight Gain: 0 Sleep: No Change Total Hours of Sleep: 8 Vegetative Symptoms:  None  ADLScreening [facility] Assessment Services) Patient's cognitive ability adequate to safely complete daily activities?: Yes Patient able to express need for assistance with ADLs?: Yes Independently performs ADLs?: Yes (appropriate for developmental age)  Prior Inpatient Therapy Prior Inpatient Therapy: No Prior Therapy Dates: na Prior Therapy Facilty/Provider(s): na Reason for Treatment: na  Prior Outpatient Therapy Prior Outpatient Therapy: No Prior Therapy Dates: na Prior Therapy Facilty/Provider(s): na Reason for Treatment: na Does patient have an ACCT team?: No Does patient have Intensive In-House Services?  : No Does patient have [facility] services? : No Does patient have P4CC services?: No  ADL Screening (condition at time of admission) Patient's cognitive ability adequate to safely complete daily activities?: Yes Patient able to express need for assistance with ADLs?: Yes Independently performs ADLs?: Yes (appropriate for developmental age)       Abuse/Neglect Assessment (Assessment to be complete while patient is alone) Physical Abuse: Denies Verbal Abuse: Denies Sexual Abuse: Denies Exploitation of patient/patient's resources: Denies Self-Neglect: Denies Values / Beliefs Cultural Requests During Hospitalization: None Spiritual Requests During Hospitalization: None Consults Spiritual Care Consult Needed: No Social Work Consult Needed: No Regulatory affairs officer (For Healthcare) Does Patient Have a Medical Advance Directive?: No    Additional Information 1:1 In Past 12 Months?: No CIRT Risk: No Elopement Risk: No Does patient have medical clearance?: Yes     Disposition:  Disposition Initial Assessment Completed for this Encounter: Yes Disposition of Patient: Other dispositions Other disposition(s): Other (Comment) (pENDING  pSYCH md CONSULT)  On Site Evaluation by:   Reviewed with Physician:    Oneita Hurt 10/25/2016 1:38 AM

## 2016-10-25 NOTE — BH Assessment (Signed)
Patient has been accepted to [facility].  Patient assigned to room [place] Dr. Weber Cooks Accepting physician is Dr. Bary Leriche  Call report to [phone]  Representative was Gigi [facility] Staff, Amy is aware of admission Patient can come after 7pm

## 2016-10-26 ENCOUNTER — Inpatient Hospital Stay
Admission: AD | Admit: 2016-10-26 | Discharge: 2016-10-26 | DRG: 885 | Disposition: A | Discharge status: Home / Self Care | Payer: 59 | Source: Ambulatory Visit | Attending: Psychiatry | Admitting: Psychiatry

## 2016-10-26 ENCOUNTER — Encounter: Payer: Self-pay | Admitting: Psychiatry

## 2016-10-26 DIAGNOSIS — F332 Major depressive disorder, recurrent severe without psychotic features: Secondary | ICD-10-CM | POA: Diagnosis present

## 2016-10-26 DIAGNOSIS — J449 Chronic obstructive pulmonary disease, unspecified: Secondary | ICD-10-CM | POA: Diagnosis present

## 2016-10-26 DIAGNOSIS — F329 Major depressive disorder, single episode, unspecified: Secondary | ICD-10-CM

## 2016-10-26 DIAGNOSIS — Z882 Allergy status to sulfonamides status: Secondary | ICD-10-CM | POA: Diagnosis not present

## 2016-10-26 DIAGNOSIS — Z6828 Body mass index (BMI) 28.0-28.9, adult: Secondary | ICD-10-CM | POA: Diagnosis not present

## 2016-10-26 DIAGNOSIS — Z880 Allergy status to penicillin: Secondary | ICD-10-CM

## 2016-10-26 DIAGNOSIS — F1020 Alcohol dependence, uncomplicated: Secondary | ICD-10-CM | POA: Diagnosis present

## 2016-10-26 DIAGNOSIS — Z91018 Allergy to other foods: Secondary | ICD-10-CM

## 2016-10-26 DIAGNOSIS — F17200 Nicotine dependence, unspecified, uncomplicated: Secondary | ICD-10-CM | POA: Diagnosis present

## 2016-10-26 DIAGNOSIS — E669 Obesity, unspecified: Secondary | ICD-10-CM | POA: Diagnosis present

## 2016-10-26 HISTORY — DX: Major depressive disorder, single episode, unspecified: F32.9

## 2016-10-26 HISTORY — DX: Chronic obstructive pulmonary disease, unspecified: J44.9

## 2016-10-26 MED ORDER — BUPROPION HCL ER (XL) 150 MG PO TB24
150.0000 mg | ORAL_TABLET | Freq: Every day | ORAL | Status: DC
  Administered 2016-10-26: 150 mg via ORAL
  Filled 2016-10-26: qty 1

## 2016-10-26 MED ORDER — ACETAMINOPHEN 325 MG PO TABS: 650.0000 mg | ORAL_TABLET | Freq: Four times a day (QID) | ORAL | Status: DC | PRN

## 2016-10-26 MED ORDER — MAGNESIUM HYDROXIDE 400 MG/5ML PO SUSP: 30.0000 mL | Freq: Every day | ORAL | Status: DC | PRN

## 2016-10-26 MED ORDER — PROPRANOLOL HCL 40 MG PO TABS: 20.0000 mg | ORAL_TABLET | Freq: Three times a day (TID) | ORAL | Status: DC | PRN

## 2016-10-26 MED ORDER — ALBUTEROL SULFATE HFA 108 (90 BASE) MCG/ACT IN AERS
2.0000 | INHALATION_SPRAY | RESPIRATORY_TRACT | Status: DC | PRN
  Filled 2016-10-26: qty 6.7

## 2016-10-26 MED ORDER — BUPROPION HCL ER (XL) 150 MG PO TB24: 150.0000 mg | ORAL_TABLET | Freq: Every day | ORAL | 1 refills | Status: DC

## 2016-10-26 MED ORDER — NICOTINE 21 MG/24HR TD PT24
21.0000 mg | MEDICATED_PATCH | Freq: Every day | TRANSDERMAL | Status: DC
  Administered 2016-10-26: 21 mg via TRANSDERMAL
  Filled 2016-10-26: qty 1

## 2016-10-26 MED ORDER — METOPROLOL TARTRATE 25 MG PO TABS
25.0000 mg | ORAL_TABLET | Freq: Two times a day (BID) | ORAL | Status: DC
  Administered 2016-10-26: 25 mg via ORAL
  Filled 2016-10-26: qty 1

## 2016-10-26 MED ORDER — NICOTINE POLACRILEX 2 MG MT GUM
2.0000 mg | CHEWING_GUM | OROMUCOSAL | Status: DC | PRN
  Filled 2016-10-26: qty 1

## 2016-10-26 MED ORDER — ALUM & MAG HYDROXIDE-SIMETH 200-200-20 MG/5ML PO SUSP: 30.0000 mL | ORAL | Status: DC | PRN
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
## 2016-10-26 NOTE — Progress Notes (Signed)
  [facility] Adult Case Management Discharge Plan :  Will you be returning to the same living situation after discharge:  Yes,  with boyfriend and children At discharge, do you have transportation home?: Yes,  boyfriend Do you have the ability to pay for your medications: Yes,  [facility] insurance  Release of information consent forms completed and in the chart;  Patient's signature needed at discharge.  Patient to Follow up at: Follow-up Information    [facility] Follow up.        [place]. Go on 10/29/2016.   Why:  Please come to your 2pm appointment with therapist Elmyra Ricks at 130pm.  Please bring photo ID and a copy of your hospital discharge paperwork. Contact information: [address] [place], [place] [address] P: [phone] F: [facility]. Go on 12/07/2016.   Why:  Please attend your medication management appointment with Dr. Adele Schilder on 12/07/16 at [place].  Please bring photo ID and your hospital discharge paperwork. Contact information: [address], [place], [place] [address] P: [phone] F: [phone]          Next level of care provider has access to [place] and Suicide Prevention discussed: Yes, with boyfriend.  Have you used any form of tobacco in the last 30 days? (Cigarettes, Smokeless Tobacco, Cigars, and/or Pipes): Yes  Has patient been referred to the Quitline?: Patient refused referral  Patient has been referred for addiction treatment: Yes  Joanne Chars, LCSW 10/26/2016, 1:40 PM

## 2016-10-26 NOTE — BHH Suicide Risk Assessment (Signed)
[place] INPATIENT:  Family/Significant Other Suicide Prevention Education  Suicide Prevention Education:  Education Completed; Ferman Hamming, boyfriend, [phone], has been identified by the patient as the family member/significant other with whom the patient will be residing, and identified as the person(s) who will aid the patient in the event of a mental health crisis (suicidal ideations/suicide attempt).  With written consent from the patient, the family member/significant other has been provided the following suicide prevention education, prior to the and/or following the discharge of the patient.  The suicide prevention education provided includes the following:  Suicide risk factors  Suicide prevention and interventions  National Suicide Hotline telephone number  [facility] assessment telephone number  [facility] Emergency Assistance [place] and/or Residential Mobile Crisis Unit telephone number  Request made of family/significant other to:  Remove weapons (e.g., guns, rifles, knives), all items previously/currently identified as safety concern.  Guns in home are all locked in 3 gun safes.  Remove drugs/medications (over-the-counter, prescriptions, illicit drugs), all items previously/currently identified as a safety concern.  The family member/significant other verbalizes understanding of the suicide prevention education information provided.  The family member/significant other agrees to remove the items of safety concern listed above.  Joanne Chars, LCSW 10/26/2016, 10:56 AM

## 2016-10-26 NOTE — Progress Notes (Signed)
Patient discharged home. DC instructions provided and explained. Medications reviewed. Rx given. All questions answered. Pt stable at discharge. Denies SI, HI, AVH. Belongings returned. Transition and Discharge risk summary given to patient along with AVS at discharge.

## 2016-10-26 NOTE — Progress Notes (Signed)
D: Patient is a 41 y.o.  year-old female admitted to [facility] ambulatory without difficulty. Patient is alert and oriented upon admission. A: Admission assessment completed without difficulty. Skin and contraband assessment completed with Laquanda MHT with no skin abnormalities except multiple tattoos right leg back, lower   No contraband found. Q.15 minute safety checks were implemented at the time of admission. Patient was oriented to the unit and escorted to room   [address] B                                                                R: Patient was receptive to and cooperative with admission assessment. Patient contracts for safety on the unit at this time Anxiety 10/10 Depression 8/10

## 2016-10-26 NOTE — Tx Team (Cosign Needed)
Initial Treatment Plan 10/26/2016 2:33 AM [name] [id number]    PATIENT STRESSORS: Financial difficulties Marital or family conflict Medication change or noncompliance Substance abuse Traumatic event   PATIENT STRENGTHS: Ability for insight Capable of independent living Social worker for treatment/growth Physical Health Supportive family/friends Work skills   PATIENT IDENTIFIED PROBLEMS: "etoh abuse when stressed"  "smoke heavily"  "poor relationship with current boyfriend"                 DISCHARGE CRITERIA:  Adequate post-discharge living arrangements Improved stabilization in mood, thinking, and/or behavior Motivation to continue treatment in a less acute level of care Verbal commitment to aftercare and medication compliance  PRELIMINARY DISCHARGE PLAN: Outpatient therapy Participate in family therapy Return to previous living arrangement Return to previous work or school arrangements  PATIENT/FAMILY INVOLVEMENT: This treatment plan has been presented to and reviewed with the patient, [name], and/or family member,   The patient and family have been given the opportunity to ask questions and make suggestions.  Hershal Coria, RN 10/26/2016, 2:33 AM

## 2016-10-26 NOTE — Progress Notes (Signed)
Recreation Therapy Notes  Date: 02.05.18 Time: 1:00 pm Location: Craft Room  Group Topic: Self-expression  Goal Area(s) Addresses:  Patient will effectively use art as a means of self-expression. Patient will be able to identify one emotion experienced during group session.  Behavioral Response: Attentive, Interactive, Left early  Intervention: Bottled Up  Activity: Patients were instructed to draw a bottle the way they see themselves. Inside the bottle, patients were instructed to write all the emotions they were feeling.  Education: LRT educated patients on different forms of self-expression.  Education Outcome: Patient left before LRT educated group.   Clinical Observations/Feedback: Patient drew bottle and wrote how she felt inside the bottle. Patient contributed to group discussion by stating why she drew her bottle the way she did and how this activity made her feel. Patient left group at approximately 1:35 pm with nurse and did not return to group.  Leonette Monarch, LRT/CTRS 10/26/2016 1:53 PM

## 2016-10-26 NOTE — BHH Suicide Risk Assessment (Signed)
[facility] Admission Suicide Risk Assessment   Nursing information obtained from:    Demographic factors:    Current Mental Status:    Loss Factors:    Historical Factors:    Risk Reduction Factors:     Total Time spent with patient: 1 hour Principal Problem: Major depressive disorder, recurrent severe without psychotic features ([place]) Diagnosis:   Patient Active Problem List   Diagnosis Date Noted  . Major depressive disorder, recurrent severe without psychotic features ([place]) [F33.2] 10/26/2016  . Alcohol use disorder, moderate, dependence ([place]) [F10.20] 10/26/2016  . COPD (chronic obstructive pulmonary disease) ([place]) [J44.9] 10/26/2016  . Vaginal dryness [N89.8] 03/14/2016  . Trimalleolar fracture of ankle, closed [S82.853A] 09/28/2015  . Atrial tachycardia ([place]) [I47.1] 08/09/2015  . Atrial tachycardia, paroxysmal ([place]) [I47.1] 01/05/2014  . Obesity (BMI 30-39.9) [E66.9] 12/19/2013  . Migraine without aura [346.1] 12/29/2011  . Anxiety [F41.9] 12/29/2011  . Insomnia [G47.00] 12/29/2011  . Tobacco use disorder [F17.200] 10/08/2010  . BRADYCARDIA [I49.8] 06/12/2009  . CAROTID BRUIT [R09.89] 06/12/2009  . INTERSTITIAL CYSTITIS [N30.10] 06/08/2009  . ENDOMETRIOSIS [N80.9] 06/08/2009  . PANIC DISORDER, HX OF [Z86.59] 06/08/2009  . TOBACCO ABUSE, HX OF [Z87.891] 06/08/2009   Subjective Data: depression.  Continued Clinical Symptoms:  Alcohol Use Disorder Identification Test Final Score (AUDIT): 11 The "Alcohol Use Disorders Identification Test", Guidelines for Use in Primary Care, Second Edition.  World Pharmacologist [facility]). Score between 0-7:  no or low risk or alcohol related problems. Score between 8-15:  moderate risk of alcohol related problems. Score between 16-19:  high risk of alcohol related problems. Score 20 or above:  warrants further diagnostic evaluation for alcohol dependence and treatment.   CLINICAL FACTORS:   Depression:   Comorbid alcohol  abuse/dependence Impulsivity Alcohol/Substance Abuse/Dependencies   Musculoskeletal: Strength & Muscle Tone: within normal limits Gait & Station: normal Patient leans: N/A  Psychiatric Specialty Exam: Physical Exam  Nursing note and vitals reviewed. Psychiatric: She has a normal mood and affect. Her speech is normal and behavior is normal. Thought content normal. Cognition and memory are normal. She expresses impulsivity.    Review of Systems  Psychiatric/Behavioral: Positive for substance abuse.  All other systems reviewed and are negative.   Blood pressure 120/69, pulse 66, temperature 98.5 F (36.9 C), temperature source Oral, resp. rate 18, height 5\' 4"  (1.626 m), weight 74.8 kg (165 lb), last menstrual period 10/03/2016, SpO2 100 %.Body mass index is 28.32 kg/m.  General Appearance: Casual  Eye Contact:  Good  Speech:  Clear and Coherent  Volume:  Normal  Mood:  Anxious  Affect:  Appropriate  Thought Process:  Goal Directed and Descriptions of Associations: Intact  Orientation:  Full (Time, Place, and Person)  Thought Content:  WDL  Suicidal Thoughts:  No  Homicidal Thoughts:  No  Memory:  Immediate;   Fair Recent;   Fair Remote;   Fair  Judgement:  Impaired  Insight:  Fair  Psychomotor Activity:  Normal  Concentration:  Concentration: Fair and Attention Span: Fair  Recall:  [employer] of Knowledge:  Fair  Language:  Fair  Akathisia:  No  Handed:  Right  AIMS (if indicated):     Assets:  Communication Skills Desire for Improvement Financial Resources/Insurance [place] Support Talents/Skills Transportation Vocational/Educational  ADL's:  Intact  Cognition:  WNL  Sleep:  Number of Hours: 3.3      COGNITIVE FEATURES THAT CONTRIBUTE TO RISK:  None    SUICIDE RISK:  Moderate:  Frequent suicidal ideation with limited intensity, and duration, some specificity in terms of plans, no associated intent, good  self-control, limited dysphoria/symptomatology, some risk factors present, and identifiable protective factors, including available and accessible social support.  PLAN OF CARE: Hospital admission, medication management, substance abuse counseling, discharge planning.  Ms. Paredez is a 41 year old female with a history of depression admitted for voicing suicidal ideation while drunk.  1. Suicidal ideation. The patient adamantly denies any thoughts, intentions, or plans to hurt herself or others. She is able to contract for safety. She is forward thinking and optimistic. She is a loving mother of 4 children   2. Mood. We restarted Wellbutrin.  3. Atrial tachycardia. She is on metoprolol.  4. Recent pneumonia. She is on Inhaler.  5. Smoking. Nicotine patch is available.  6. Alcohol abuse. The patient minimizes her problems and declines treatment. Vital signs are stable. There are no signs of alcohol withdrawal.  7. Disposition. She will be discharged to home. She will follow up with [facility].   I certify that inpatient services furnished can reasonably be expected to improve the patient's condition.   Orson Slick, MD 10/26/2016, 8:12 AM

## 2016-10-26 NOTE — ED Notes (Signed)
Repot was called to Janne Lab., RN on BMU.

## 2016-10-26 NOTE — H&P (Signed)
Psychiatric Admission Assessment Adult  Patient Identification: [name] MRN:  [id number] Date of Evaluation:  10/26/2016 Chief Complaint:  mag depressive disorder Principal Diagnosis: Major depressive disorder, recurrent severe without psychotic features ([place]) Diagnosis:   Patient Active Problem List   Diagnosis Date Noted  . Major depressive disorder, recurrent severe without psychotic features ([place]) [F33.2] 10/26/2016  . Alcohol use disorder, moderate, dependence ([place]) [F10.20] 10/26/2016  . COPD (chronic obstructive pulmonary disease) ([place]) [J44.9] 10/26/2016  . Vaginal dryness [N89.8] 03/14/2016  . Trimalleolar fracture of ankle, closed [S82.853A] 09/28/2015  . Atrial tachycardia ([place]) [I47.1] 08/09/2015  . Atrial tachycardia, paroxysmal ([place]) [I47.1] 01/05/2014  . Obesity (BMI 30-39.9) [E66.9] 12/19/2013  . Migraine without aura [346.1] 12/29/2011  . Anxiety [F41.9] 12/29/2011  . Insomnia [G47.00] 12/29/2011  . Tobacco use disorder [F17.200] 10/08/2010  . BRADYCARDIA [I49.8] 06/12/2009  . CAROTID BRUIT [R09.89] 06/12/2009  . INTERSTITIAL CYSTITIS [N30.10] 06/08/2009  . ENDOMETRIOSIS [N80.9] 06/08/2009  . PANIC DISORDER, HX OF [Z86.59] 06/08/2009  . TOBACCO ABUSE, HX OF [Z87.891] 06/08/2009   History of Present Illness:   Identifying data. [name] is a 41 year old female with a history of depression.  Chief complaint. "This was stupid."  History of present illness. Information was obtained from the patient and the chart. The patient has a history of depression and anxiety but has been off Wellbutrin for over half a year now. On the day of admission, she was drinking some and arguing with her boyfriend of 14 years who has been cheating on her recently. She believes that because she was dunk, she made some "stupid" remarks indicating that she wanted to put a belt around her neck and hanging herself. The boyfriend called the police. In the emergency room the patient  oriented denied any thoughts of suicide and felt embarrassed. She denies symptoms of depression but not this that without Wellbutrin she is less focus her energies suffered. She denies psychotic symptoms or symptoms suggestive of bipolar mania. She has infrequent panic attacks that sometimes associated with atrial tachycardia for which she takes metoprolol. The patient drinks several times a week but never to excess. She does not use other drugs.  Past psychiatry history. There were no prior hospitalizations or suicide attempts. She did well on Wellbutrin.  Family psychiatric history. She is adopted but does not think there is a history of mental illness.  Social history. She lives with her boyfriend of 83 years and 4 children ages 11, 39, 5, and 73. She works at the [employer] in [place]. She does have health insurance.  Total Time spent with patient: 1 hour  Is the patient at risk to self? No.  Has the patient been a risk to self in the past 6 months? No.  Has the patient been a risk to self within the distant past? No.  Is the patient a risk to others? No.  Has the patient been a risk to others in the past 6 months? No.  Has the patient been a risk to others within the distant past? No.   Prior Inpatient Therapy:   Prior Outpatient Therapy:    Alcohol Screening: 1. How often do you have a drink containing alcohol?: 2 to 3 times a week 2. How many drinks containing alcohol do you have on a typical day when you are drinking?: 3 or 4 3. How often do you have six or more drinks on one occasion?: Weekly Preliminary Score: 4 4. How often during the last year have  you found that you were not able to stop drinking once you had started?: Weekly 5. How often during the last year have you failed to do what was normally expected from you becasue of drinking?: Never 6. How often during the last year have you needed a first drink in the morning to get yourself going after a heavy drinking  session?: Never 7. How often during the last year have you had a feeling of guilt of remorse after drinking?: Less than monthly 8. How often during the last year have you been unable to remember what happened the night before because you had been drinking?: Never 9. Have you or someone else been injured as a result of your drinking?: No 10. Has a relative or friend or a doctor or another health worker been concerned about your drinking or suggested you cut down?: No Alcohol Use Disorder Identification Test Final Score (AUDIT): 11 Brief Intervention: Yes Substance Abuse History in the last 12 months:  Yes.   Consequences of Substance Abuse: Negative Previous Psychotropic Medications: No  Psychological Evaluations: No  Past Medical History:  Past Medical History:  Diagnosis Date  . Arrhythmia    Bradycardia  . COPD (chronic obstructive pulmonary disease) ([place]) 10/26/2016  . Endometriosis   . Headache   . History of tobacco abuse   . Interstitial cystitis   . Major depression 10/26/2016  . Medical history non-contributory   . MRSA (methicillin resistant staph aureus) culture positive 2013  . Ovarian cyst   . Panic disorder   . Pyelonephritis   . PYELONEPHRITIS 06/08/2009   Qualifier: Diagnosis of  By: Sidney Ace    . Sinus problem    Right maxillary (frequent)    Past Surgical History:  Procedure Laterality Date  . APPENDECTOMY    . LAPAROSCOPY    . ORIF ANKLE FRACTURE Left 09/29/2015   Procedure: OPEN REDUCTION INTERNAL FIXATION (ORIF) ANKLE FRACTURE;  Surgeon: Hessie Knows, MD;  Location: [facility] ORS;  Service: Orthopedics;  Laterality: Left;   Family History:  Family History  Problem Relation Age of Onset  . Leukemia Paternal Grandfather   . Thyroid disease Maternal Aunt   . Cancer Neg Hx     No obvious cancer  . Breast cancer Neg Hx    Tobacco Screening: Have you used any form of tobacco in the last 30 days? (Cigarettes, Smokeless Tobacco, Cigars, and/or Pipes):  Yes Tobacco use, Select all that apply: 5 or more cigarettes per day Are you interested in Tobacco Cessation Medications?: Yes, will notify MD for an order Counseled patient on smoking cessation including recognizing danger situations, developing coping skills and basic information about quitting provided: Yes Social History:  History  Alcohol Use  . 2.4 oz/week  . 4 Glasses of wine per week     History  Drug Use No    Additional Social History:                           Allergies:   Allergies  Allergen Reactions  . Sulfonamide Derivatives Anaphylaxis  . Other     Red food Coloring  . Penicillins Other (See Comments)    Unknown reactions from PT   Lab Results:  Results for orders placed or performed during the hospital encounter of 10/24/16 (from the past 48 hour(s))  Urine Drug Screen, Qualitative     Status: None   Collection Time: 10/24/16  9:38 PM  Result Value Ref Range  Tricyclic, Ur Screen NONE DETECTED NONE DETECTED   Amphetamines, Ur Screen NONE DETECTED NONE DETECTED   MDMA (Ecstasy)Ur Screen NONE DETECTED NONE DETECTED   Cocaine Metabolite,Ur [place] NONE DETECTED NONE DETECTED   Opiate, Ur Screen NONE DETECTED NONE DETECTED   Phencyclidine (PCP) Ur S NONE DETECTED NONE DETECTED   Cannabinoid 50 Ng, Ur  NONE DETECTED NONE DETECTED   Barbiturates, Ur Screen NONE DETECTED NONE DETECTED   Benzodiazepine, Ur Scrn NONE DETECTED NONE DETECTED   Methadone Scn, Ur NONE DETECTED NONE DETECTED    Comment: (NOTE) 676  Tricyclics, urine               Cutoff 1000 ng/mL 200  Amphetamines, urine             Cutoff 1000 ng/mL 300  MDMA (Ecstasy), urine           Cutoff 500 ng/mL 400  Cocaine Metabolite, urine       Cutoff 300 ng/mL 500  Opiate, urine                   Cutoff 300 ng/mL 600  Phencyclidine (PCP), urine      Cutoff 25 ng/mL 700  Cannabinoid, urine              Cutoff 50 ng/mL 800  Barbiturates, urine             Cutoff 200 ng/mL 900  Benzodiazepine,  urine           Cutoff 200 ng/mL 1000 Methadone, urine                Cutoff 300 ng/mL 1100 1200 The urine drug screen provides only a preliminary, unconfirmed 1300 analytical test result and should not be used for non-medical 1400 purposes. Clinical consideration and professional judgment should 1500 be applied to any positive drug screen result due to possible 1600 interfering substances. A more specific alternate chemical method 1700 must be used in order to obtain a confirmed analytical result.  1800 Gas chromato graphy / mass spectrometry (GC/MS) is the preferred 1900 confirmatory method.   Pregnancy, urine POC     Status: None   Collection Time: 10/24/16 10:07 PM  Result Value Ref Range   Preg Test, Ur NEGATIVE NEGATIVE    Comment:        THE SENSITIVITY OF THIS METHODOLOGY IS >24 mIU/mL   Comprehensive metabolic panel     Status: Abnormal   Collection Time: 10/24/16 10:43 PM  Result Value Ref Range   Sodium 141 135 - 145 mmol/L   Potassium 4.4 3.5 - 5.1 mmol/L   Chloride 109 101 - 111 mmol/L   CO2 23 22 - 32 mmol/L   Glucose, Bld 91 65 - 99 mg/dL   BUN 14 6 - 20 mg/dL   Creatinine, Ser 0.79 0.44 - 1.00 mg/dL   Calcium 8.4 (L) 8.9 - 10.3 mg/dL   Total Protein 6.7 6.5 - 8.1 g/dL   Albumin 4.1 3.5 - 5.0 g/dL   AST 18 15 - 41 U/L   ALT 16 14 - 54 U/L   Alkaline Phosphatase 50 38 - 126 U/L   Total Bilirubin 0.3 0.3 - 1.2 mg/dL   GFR calc non Af Amer >60 >60 mL/min   GFR calc Af Amer >60 >60 mL/min    Comment: (NOTE) The eGFR has been calculated using the CKD EPI equation. This calculation has not been validated in all clinical situations. eGFR's persistently <60 mL/min  signify possible Chronic Kidney Disease.    Anion gap 9 5 - 15  Ethanol     Status: Abnormal   Collection Time: 10/24/16 10:43 PM  Result Value Ref Range   Alcohol, Ethyl (B) 196 (H) <5 mg/dL    Comment:        LOWEST DETECTABLE LIMIT FOR SERUM ALCOHOL IS 5 mg/dL FOR MEDICAL PURPOSES ONLY    Salicylate level     Status: None   Collection Time: 10/24/16 10:43 PM  Result Value Ref Range   Salicylate Lvl <8.0 2.8 - 30.0 mg/dL  Acetaminophen level     Status: Abnormal   Collection Time: 10/24/16 10:43 PM  Result Value Ref Range   Acetaminophen (Tylenol), Serum <10 (L) 10 - 30 ug/mL    Comment:        THERAPEUTIC CONCENTRATIONS VARY SIGNIFICANTLY. A RANGE OF 10-30 ug/mL MAY BE AN EFFECTIVE CONCENTRATION FOR MANY PATIENTS. HOWEVER, SOME ARE BEST TREATED AT CONCENTRATIONS OUTSIDE THIS RANGE. ACETAMINOPHEN CONCENTRATIONS >150 ug/mL AT 4 HOURS AFTER INGESTION AND >50 ug/mL AT 12 HOURS AFTER INGESTION ARE OFTEN ASSOCIATED WITH TOXIC REACTIONS.   cbc     Status: None   Collection Time: 10/24/16 10:43 PM  Result Value Ref Range   WBC 5.2 3.6 - 11.0 K/uL   RBC 4.13 3.80 - 5.20 MIL/uL   Hemoglobin 13.6 12.0 - 16.0 g/dL   HCT 40.6 35.0 - 47.0 %   MCV 98.2 80.0 - 100.0 fL   MCH 32.9 26.0 - 34.0 pg   MCHC 33.6 32.0 - 36.0 g/dL   RDW 12.6 11.5 - 14.5 %   Platelets 209 150 - 440 K/uL    Blood Alcohol level:  Lab Results  Component Value Date   ETH 196 (H) 10/24/2016   ETH 334 (HH) 99/83/3825    Metabolic Disorder Labs:  No results found for: HGBA1C, MPG No results found for: PROLACTIN Lab Results  Component Value Date   CHOL 150 12/03/2011   TRIG 44 12/03/2011   HDL 59 12/03/2011   CHOLHDL 2.5 12/03/2011   VLDL 9 12/03/2011   LDLCALC 82 12/03/2011    Current Medications: Current Facility-Administered Medications  Medication Dose Route Frequency Provider Last Rate Last Dose  . acetaminophen (TYLENOL) tablet 650 mg  650 mg Oral Q6H PRN Gonzella Lex, MD      . albuterol (PROVENTIL HFA;VENTOLIN HFA) 108 (90 Base) MCG/ACT inhaler 2 puff  2 puff Inhalation Q4H PRN Gonzella Lex, MD      . alum & mag hydroxide-simeth (MAALOX/MYLANTA) 200-200-20 MG/5ML suspension 30 mL  30 mL Oral Q4H PRN Gonzella Lex, MD      . buPROPion (WELLBUTRIN XL) 24 hr tablet 150 mg   150 mg Oral Daily Gonzella Lex, MD   150 mg at 10/26/16 0800  . magnesium hydroxide (MILK OF MAGNESIA) suspension 30 mL  30 mL Oral Daily PRN Gonzella Lex, MD      . metoprolol tartrate (LOPRESSOR) tablet 25 mg  25 mg Oral BID Gonzella Lex, MD   25 mg at 10/26/16 0800  . nicotine (NICODERM CQ - dosed in mg/24 hours) patch 21 mg  21 mg Transdermal Daily Gonzella Lex, MD   21 mg at 10/26/16 0801  . propranolol (INDERAL) tablet 20 mg  20 mg Oral TID PRN Gonzella Lex, MD       PTA Medications: Prescriptions Prior to Admission  Medication Sig Dispense Refill Last Dose  . buPROPion [facility]  XL) 150 MG 24 hr tablet Take 150 mg by mouth daily.     Marland Kitchen ibuprofen (ADVIL,MOTRIN) 200 MG tablet Take 200 mg by mouth as needed.     10/24/2016 at Unknown time  . loratadine-pseudoephedrine (CLARITIN-D 24-HOUR) 10-240 MG 24 hr tablet Take 1 tablet by mouth daily.   Past Week at Unknown time  . metoprolol tartrate (LOPRESSOR) 25 MG tablet Take 25 mg by mouth 2 (two) times daily. As needed   Not Taking  . Multiple Vitamin (MULTIVITAMIN WITH MINERALS) TABS tablet Take 1 tablet by mouth daily.   Past Month at Unknown time  . propranolol (INDERAL) 20 MG tablet Take 1 tablet (20 mg total) by mouth 3 (three) times daily as needed (tachycardia). 90 tablet 4 prn  . PROVENTIL HFA 108 (90 Base) MCG/ACT inhaler Inhale 2 puffs into the lungs every 4 (four) hours as needed.   Past Month at Unknown time    Musculoskeletal: Strength & Muscle Tone: within normal limits Gait & Station: normal Patient leans: N/A  Psychiatric Specialty Exam: Physical Exam  Nursing note and vitals reviewed. Constitutional: She is oriented to person, place, and time. She appears well-developed and well-nourished.  HENT:  Head: Normocephalic and atraumatic.  Eyes: Conjunctivae and EOM are normal. Pupils are equal, round, and reactive to light.  Neck: Normal range of motion. Neck supple.  Cardiovascular: Normal rate, regular rhythm  and normal heart sounds.   Respiratory: Effort normal and breath sounds normal.  GI: Soft. Bowel sounds are normal.  Musculoskeletal: Normal range of motion.  Neurological: She is alert and oriented to person, place, and time.  Skin: Skin is warm and dry.  Psychiatric: She has a normal mood and affect. Her speech is normal and behavior is normal. Thought content normal. Cognition and memory are normal. She expresses impulsivity.    ROS  Blood pressure 120/69, pulse 66, temperature 98.5 F (36.9 C), temperature source Oral, resp. rate 18, height 5' 4"  (1.626 m), weight 74.8 kg (165 lb), last menstrual period 10/03/2016, SpO2 100 %.Body mass index is 28.32 kg/m.  See SRA.                                                  Sleep:  Number of Hours: 3.3    Treatment Plan Summary: Daily contact with patient to assess and evaluate symptoms and progress in treatment and Medication management   Ms. Sarria is a 41 year old female with a history of depression admitted for voicing suicidal ideation while drunk.  1. Suicidal ideation. The patient adamantly denies any thoughts, intentions, or plans to hurt herself or others. She is able to contract for safety. She is forward thinking and optimistic. She is a loving mother of 4 children   2. Mood. We restarted Wellbutrin.  3. Atrial tachycardia. She is on metoprolol.  4. Recent pneumonia. She is on Inhaler.  5. Smoking. Nicotine patch is available.  6. Alcohol abuse. The patient minimizes her problems and declines treatment. Vital signs are stable. There are no signs of alcohol withdrawal.  7. Disposition. She will be discharged to home. She will follow up with [facility].    Observation Level/Precautions:  15 minute checks  Laboratory:  CBC Chemistry Profile UDS UA  Psychotherapy:    Medications:    Consultations:    Discharge Concerns:    Estimated LOS:  Other:     Physician Treatment Plan for Primary Diagnosis:  Major depressive disorder, recurrent severe without psychotic features ([place]) Long Term Goal(s): Improvement in symptoms so as ready for discharge  Short Term Goals: Ability to identify changes in lifestyle to reduce recurrence of condition will improve, Ability to verbalize feelings will improve, Ability to disclose and discuss suicidal ideas, Ability to demonstrate self-control will improve, Ability to identify and develop effective coping behaviors will improve, Ability to maintain clinical measurements within normal limits will improve, Compliance with prescribed medications will improve and Ability to identify triggers associated with substance abuse/mental health issues will improve  Physician Treatment Plan for Secondary Diagnosis: Principal Problem:   Major depressive disorder, recurrent severe without psychotic features ([place]) Active Problems:   Tobacco use disorder   Alcohol use disorder, moderate, dependence ([place])   COPD (chronic obstructive pulmonary disease) ([place])  Long Term Goal(s): Improvement in symptoms so as ready for discharge  Short Term Goals: Ability to identify changes in lifestyle to reduce recurrence of condition will improve, Ability to demonstrate self-control will improve and Ability to identify triggers associated with substance abuse/mental health issues will improve  I certify that inpatient services furnished can reasonably be expected to improve the patient's condition.    Orson Slick, MD 2/5/20188:52 AM

## 2016-10-26 NOTE — [facility] Suicide Risk Assessment (Signed)
[facility] Discharge Suicide Risk Assessment   Principal Problem: Major depressive disorder, recurrent severe without psychotic features [facility]) Discharge Diagnoses:  Patient Active Problem List   Diagnosis Date Noted  . Major depressive disorder, recurrent severe without psychotic features ([place]) [F33.2] 10/26/2016  . Alcohol use disorder, moderate, dependence ([place]) [F10.20] 10/26/2016  . COPD (chronic obstructive pulmonary disease) ([place]) [J44.9] 10/26/2016  . Vaginal dryness [N89.8] 03/14/2016  . Trimalleolar fracture of ankle, closed [S82.853A] 09/28/2015  . Atrial tachycardia ([place]) [I47.1] 08/09/2015  . Atrial tachycardia, paroxysmal ([place]) [I47.1] 01/05/2014  . Obesity (BMI 30-39.9) [E66.9] 12/19/2013  . Migraine without aura [346.1] 12/29/2011  . Anxiety [F41.9] 12/29/2011  . Insomnia [G47.00] 12/29/2011  . Tobacco use disorder [F17.200] 10/08/2010  . BRADYCARDIA [I49.8] 06/12/2009  . CAROTID BRUIT [R09.89] 06/12/2009  . INTERSTITIAL CYSTITIS [N30.10] 06/08/2009  . ENDOMETRIOSIS [N80.9] 06/08/2009  . PANIC DISORDER, HX OF [Z86.59] 06/08/2009  . TOBACCO ABUSE, HX OF [Z87.891] 06/08/2009    Total Time spent with patient: 1 hour  Musculoskeletal: Strength & Muscle Tone: within normal limits Gait & Station: normal Patient leans: N/A  Psychiatric Specialty Exam: Review of Systems  Psychiatric/Behavioral: Positive for substance abuse.  All other systems reviewed and are negative.   Blood pressure 120/69, pulse 66, temperature 98.5 F (36.9 C), temperature source Oral, resp. rate 18, height 5\' 4"  (1.626 m), weight 74.8 kg (165 lb), last menstrual period 10/03/2016, SpO2 100 %.Body mass index is 28.32 kg/m.  General Appearance: Casual  Eye Contact::  Good  Speech:  Clear and Coherent409  Volume:  Normal  Mood:  Anxious  Affect:  Appropriate  Thought Process:  Goal Directed and Descriptions of Associations: Intact  Orientation:  Full (Time, Place, and Person)  Thought Content:   WDL  Suicidal Thoughts:  No  Homicidal Thoughts:  No  Memory:  Immediate;   Fair Recent;   Fair Remote;   Fair  Judgement:  Impaired  Insight:  Fair  Psychomotor Activity:  Normal  Concentration:  Fair  Recall:  [employer] [place]  Language: Fair  Akathisia:  No  Handed:  Right  AIMS (if indicated):     Assets:  Communication Skills Desire for Improvement Financial Resources/Insurance [place] Talents/Skills Transportation Vocational/Educational  Sleep:  Number of Hours: 3.3  Cognition: WNL  ADL's:  Intact   Mental Status Per Nursing Assessment::   On Admission:     Demographic Factors:  Caucasian  Loss Factors: Loss of significant relationship  Historical Factors: Impulsivity  Risk Reduction Factors:   Responsible for children under 11 years of age, Sense of responsibility to family, Living with another person, especially a relative and Positive social support  Continued Clinical Symptoms:  Depression:   Comorbid alcohol abuse/dependence Impulsivity Alcohol/Substance Abuse/Dependencies  Cognitive Features That Contribute To Risk:  None    Suicide Risk:  Minimal: No identifiable suicidal ideation.  Patients presenting with no risk factors but with morbid ruminations; may be classified as minimal risk based on the severity of the depressive symptoms    Plan Of Care/Follow-up recommendations:  Activity:  As tolerated. Diet:  Low sodium heart healthy. Other:  Keep follow-up appointments.  Orson Slick, MD 10/26/2016, 9:42 AM
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
## 2016-10-29 ENCOUNTER — Ambulatory Visit (INDEPENDENT_AMBULATORY_CARE_PROVIDER_SITE_OTHER): Payer: 59 | Admitting: Licensed Clinical Social Worker

## 2016-10-29 DIAGNOSIS — F332 Major depressive disorder, recurrent severe without psychotic features: Secondary | ICD-10-CM
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
## 2016-11-02 ENCOUNTER — Other Ambulatory Visit: Payer: Self-pay

## 2016-11-02 MED ORDER — PROPRANOLOL HCL 20 MG PO TABS: 20.0000 mg | ORAL_TABLET | Freq: Three times a day (TID) | ORAL | 4 refills | Status: DC | PRN

## 2016-11-02 MED ORDER — METOPROLOL TARTRATE 25 MG PO TABS: 25.0000 mg | ORAL_TABLET | Freq: Two times a day (BID) | ORAL | 3 refills | Status: DC

## 2016-11-02 NOTE — Telephone Encounter (Signed)
Requested Prescriptions   Signed Prescriptions Disp Refills  . metoprolol tartrate (LOPRESSOR) 25 MG tablet 60 tablet 3    Sig: Take 1 tablet (25 mg total) by mouth 2 (two) times daily. As needed    Authorizing Provider: Minna Merritts    Ordering User: Janan Ridge

## 2016-11-02 NOTE — Telephone Encounter (Signed)
Requested Prescriptions   Signed Prescriptions Disp Refills  . propranolol (INDERAL) 20 MG tablet 90 tablet 4    Sig: Take 1 tablet (20 mg total) by mouth 3 (three) times daily as needed (tachycardia).    Authorizing Provider: Minna Merritts    Ordering User: Janan Ridge

## 2016-11-13 ENCOUNTER — Encounter (HOSPITAL_COMMUNITY): Payer: Self-pay | Admitting: Psychiatry

## 2016-11-13 ENCOUNTER — Ambulatory Visit (INDEPENDENT_AMBULATORY_CARE_PROVIDER_SITE_OTHER): Payer: 59 | Admitting: Psychiatry

## 2016-11-13 VITALS — BP 118/68 | HR 84 | Ht 64.0 in | Wt 164.0 lb

## 2016-11-13 DIAGNOSIS — Z79899 Other long term (current) drug therapy: Secondary | ICD-10-CM | POA: Diagnosis not present

## 2016-11-13 DIAGNOSIS — F17210 Nicotine dependence, cigarettes, uncomplicated: Secondary | ICD-10-CM | POA: Diagnosis not present

## 2016-11-13 DIAGNOSIS — F4310 Post-traumatic stress disorder, unspecified: Secondary | ICD-10-CM | POA: Diagnosis not present

## 2016-11-13 DIAGNOSIS — F332 Major depressive disorder, recurrent severe without psychotic features: Secondary | ICD-10-CM | POA: Diagnosis not present

## 2016-11-13 MED ORDER — BUPROPION HCL ER (XL) 150 MG PO TB24: 150.0000 mg | ORAL_TABLET | Freq: Every day | ORAL | 1 refills | Status: DC

## 2016-11-13 MED ORDER — PRAZOSIN HCL 2 MG PO CAPS: 2.0000 mg | ORAL_CAPSULE | Freq: Every day | ORAL | 1 refills | Status: DC

## 2016-11-13 MED ORDER — FLUOXETINE HCL 10 MG PO CAPS: 10.0000 mg | ORAL_CAPSULE | Freq: Every day | ORAL | 2 refills | Status: DC

## 2016-11-13 MED ORDER — ALPRAZOLAM 0.25 MG PO TABS: 0.2500 mg | ORAL_TABLET | Freq: Three times a day (TID) | ORAL | 0 refills | Status: DC | PRN

## 2016-11-13 NOTE — Patient Instructions (Addendum)
Continue Wellbutrin 150 mg XL daily   Start Prozac 10 mg daily (increase to 20 mg in 1 week)   Start Prazosin 2 mg at night for sleep  Use Xanax if you have a panic attack  Check out the website for NAMI (national alliance for mental illness) to look for PTSD and depression groups      Posttraumatic Stress Disorder Posttraumatic stress disorder (PTSD) is a mental disorder. It occurs after a traumatic event in your life. The traumatic events that cause PTSD are outside the range of normal human experience. Examples of these events include war, automobile accidents, natural disasters, rape, domestic violence, and violent crimes. Most people who experience these types of events are able to heal on their own. Those who do not heal develop PTSD. PTSD can happen to anyone at any age. However, people with a history of childhood abuse are at increased risk for developing PTSD.  SYMPTOMS  The traumatic event that causes PTSD must be a threat to life, cause serious injury, or involve sexual violence. The traumatic event is usually experienced directly by the person who develops PTSD. Sometimes PTSD occurs in people who witness traumas that occur to others or who hear about a trauma that occurs to a close family member or friend. The following behaviors are characteristic of people with PTSD:  People with PTSD re-experience the traumatic event in one or more of the following ways (intrusion symptoms):  Recurrent, unwanted distressing memories while awake.  Recurrent distressing dreams.  Sensations similar to those felt when the event originally occurred (flashbacks).   Intense or prolonged emotional distress, triggered by reminders of the trauma. This may include fear, horror, intense sadness, or anger.  Marked physical reactions, triggered by reminders of the trauma. This may include racing heart, shortness of breath, sweating, and shaking.  People with PTSD avoid thoughts, conversations,  people, or activities that remind them of the traumatic event (avoidance symptoms).  People with PTSD have negative changes in their thinking and mood after the traumatic event. These changes include:  Inability to remember one or more significant aspects of the traumatic event (memory gaps).  Exaggerated negative perceptions about themselves or others, such as believing that they are bad people or that no one can be trusted.  Unrealistic assignment of blame to themselves or others for the traumatic event.  Persistent negative emotional state, such as fear, horror, anger, sadness, guilt, or shame.  Markedly decreased interest or participation in significant activities.  A loss of connection with other people.  Inability to experience positive emotions, such as happiness or love.  People with PTSD are more sensitive to their environment and react more easily than others (hyperarousal-overreactivity symptoms). These symptoms include:  Irritability, with angry outbursts toward other people or objects. The outbursts are easily triggered and may be verbal or physical.  Careless or self-destructive behavior. This may include reckless driving or drug use.  A feeling of being on edge, with increased alertness (hypervigilance).  Exaggerated reactions to stimuli, such as being easily startled.   Difficulty concentrating.  Difficulty sleeping. PTSD symptoms may start soon after a frightening event or months or years later. They last at least 1 month or longer and can affect one or more areas of functioning, such as social or occupational functioning.  DIAGNOSIS  PTSD is diagnosed through an assessment by a mental health professional. [name] will be asked questions about the traumatic events in your life. You will also be asked about how these events  have changed your thoughts, mood, behavior, and ability to function on a daily basis. You may be asked about your use of alcohol or drugs, which  can make PTSD symptoms worse. TREATMENT  Unlike many mental disorders, which require lifelong management, PTSD is a curable condition. The goal of PTSD treatment is to neutralize the negative effects of the traumatic event on daily functioning, not erase the memory of the event. The following treatments may be prescribed to reach this goal:  Medicines. Certain medicines can reduce some PTSD symptoms. Intrusion symptoms and hyperarousal-overactivity symptoms respond best to medicines.  Counseling (talk therapy). Talk therapy with a mental health professional who is experienced in treating PTSD can help. Talk therapy can provide education, emotional support, and coping skills. Certain types of talk therapy that specifically target the traumatic events are the most effective treatment for PTSD:  Prolonged exposure therapy, which involves remembering and processing the traumatic event with a therapist in a safe environment until it no longer creates a negative emotional response.  Eye movement desensitization and reprocessing therapy, which involves the use of repetitive physical stimulation of the senses that alternates between the right and left sides of the body. It is believed that this therapy facilitates communication between the two sides of the brain. This communication helps the mind to integrate the fragmented memories of the traumatic event into a whole story that makes sense and no longer creates a negative emotional response. Most people with PTSD benefit from a combination of these treatments.  This information is not intended to replace advice given to you by your health care provider. Make sure you discuss any questions you have with your health care provider. Document Released: 06/02/2001 Document Revised: 12/30/2015 Document Reviewed: 08/23/2015 Elsevier Interactive Patient Education  2017 [employer].
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
## 2016-11-27 ENCOUNTER — Ambulatory Visit: Payer: 59 | Admitting: Licensed Clinical Social Worker

## 2016-12-02 ENCOUNTER — Other Ambulatory Visit (HOSPITAL_COMMUNITY): Payer: Self-pay

## 2016-12-02 DIAGNOSIS — F4310 Post-traumatic stress disorder, unspecified: Secondary | ICD-10-CM

## 2016-12-02 MED ORDER — FLUOXETINE HCL 20 MG PO CAPS: 20.0000 mg | ORAL_CAPSULE | Freq: Every day | ORAL | 2 refills | Status: DC

## 2016-12-02 NOTE — Progress Notes (Signed)
Patient called, she said that she was told to increase her Prozac to 20 mg after one week. Patient did this, but insurance will not pay for another prescription of the 10 mg that was originally sent in because it is too early. I sent a new order to the pharmacy for 20 mg 1 po qd, per your note she was to increase to 20 mg. Patient is aware.

## 2016-12-04 ENCOUNTER — Ambulatory Visit: Payer: 59 | Admitting: Licensed Clinical Social Worker

## 2016-12-07 ENCOUNTER — Ambulatory Visit (HOSPITAL_COMMUNITY): Payer: Self-pay | Admitting: Psychiatry

## 2016-12-11 ENCOUNTER — Ambulatory Visit (INDEPENDENT_AMBULATORY_CARE_PROVIDER_SITE_OTHER): Payer: 59 | Admitting: Psychiatry

## 2016-12-11 VITALS — BP 120/72 | HR 68 | Ht 64.0 in | Wt 162.4 lb

## 2016-12-11 DIAGNOSIS — F17210 Nicotine dependence, cigarettes, uncomplicated: Secondary | ICD-10-CM

## 2016-12-11 DIAGNOSIS — Z882 Allergy status to sulfonamides status: Secondary | ICD-10-CM

## 2016-12-11 DIAGNOSIS — F332 Major depressive disorder, recurrent severe without psychotic features: Secondary | ICD-10-CM

## 2016-12-11 DIAGNOSIS — F4310 Post-traumatic stress disorder, unspecified: Secondary | ICD-10-CM

## 2016-12-11 DIAGNOSIS — Z79899 Other long term (current) drug therapy: Secondary | ICD-10-CM | POA: Diagnosis not present

## 2016-12-11 DIAGNOSIS — Z880 Allergy status to penicillin: Secondary | ICD-10-CM

## 2016-12-11 MED ORDER — ALPRAZOLAM 0.25 MG PO TABS: 0.2500 mg | ORAL_TABLET | Freq: Three times a day (TID) | ORAL | 0 refills | Status: DC | PRN

## 2016-12-11 MED ORDER — FLUOXETINE HCL 40 MG PO CAPS: 40.0000 mg | ORAL_CAPSULE | Freq: Every day | ORAL | 1 refills | Status: DC

## 2016-12-11 NOTE — Progress Notes (Signed)
[place] MD/PA/NP OP Progress Note  12/11/2016 9:26 AM [name]  MRN:  [id number]  Chief Complaint: some anxiety still, but better Subjective:  Patient presents today for psychiatric med management follow-up. She reports that she has had a good response to increase of Prozac to 20 mg. She reports that she had some mild diarrhea, but this is resolved. She has noticed some mild bruxism, but this is also reduced. She denies any suicidal thoughts. She feels like she is able to more clearly think through her anxiety provoking life stressors. She and her partner are talking about their relationship issues more calmly. She is agreeable to discontinuing Wellbutrin, as per our previous discussion. She had some headaches with prazosin, so discontinued this, but noted that her sleep has been improved with the Prozac anyways. She is not using Xanax very often, about 2-3 times per week for episodes of panic. She continues to have therapy follow-up in [facility], but had to cancel last week due to illness. She has an appointment coming up in the next week. She agrees to increase the Prozac to 40 mg, and we reviewed the risks and benefits and side effects. Agrees to follow-up in 8 weeks or sooner if needed.  Regarding her home behaviors, she reports that she is gone on some walks, and she is actually started to put out her plantars to plant her herbs for the spring, which she has not done in a long time. She feels like this is a positive reflection on her mood  Visit Diagnosis:    ICD-9-CM ICD-10-CM   1. PTSD (post-traumatic stress disorder) 309.81 F43.10 ALPRAZolam (XANAX) 0.25 MG tablet     FLUoxetine (PROZAC) 40 MG capsule  2. Severe recurrent major depression without psychotic features ([place]) 296.33 F33.2 FLUoxetine (PROZAC) 40 MG capsule    Past Psychiatric History: See intake H&P for full details. Reviewed, with no updates at this time.  Past Medical History:  Past Medical History:  Diagnosis Date  .  Arrhythmia    Bradycardia  . COPD (chronic obstructive pulmonary disease) ([place]) 10/26/2016  . Endometriosis   . Headache   . History of tobacco abuse   . Interstitial cystitis   . Major depression 10/26/2016  . Medical history non-contributory   . MRSA (methicillin resistant staph aureus) culture positive 2013  . Ovarian cyst   . Panic disorder   . Pyelonephritis   . PYELONEPHRITIS 06/08/2009   Qualifier: Diagnosis of  By: [facility] Ace    . Sinus problem    Right maxillary (frequent)    Past Surgical History:  Procedure Laterality Date  . APPENDECTOMY    . LAPAROSCOPY    . ORIF ANKLE FRACTURE Left 09/29/2015   Procedure: OPEN REDUCTION INTERNAL FIXATION (ORIF) ANKLE FRACTURE;  Surgeon: Hessie Knows, MD;  Location: [facility] ORS;  Service: Orthopedics;  Laterality: Left;    Family Psychiatric History: See intake H&P for full details. Reviewed, with no updates at this time.   Family History:  Family History  Problem Relation Age of Onset  . Leukemia Paternal Grandfather   . Thyroid disease Maternal Aunt   . Cancer Neg Hx     No obvious cancer  . Breast cancer Neg Hx     Social History:  Social History   Social History  . Marital status: Divorced    Spouse name: N/A  . Number of children: 2  . Years of education: N/A   Occupational History  . 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 Smoking status: Current Every Day Smoker    Packs/day: 0.50    Years: 11.00    Types: Cigarettes  . Smokeless tobacco: Never Used  . Alcohol use 2.4 oz/week    4 Glasses of wine per week  . Drug use: No  . Sexual activity: Yes    Partners: Male    Birth control/ protection: , Surgical     Comment: vasectomy   Other Topics Concern  . Not on file   Social History Narrative   Started smoking related to the tension of her first marriage and 2 children.    Allergies:  Allergies  Allergen Reactions  . Sulfonamide Derivatives Anaphylaxis  . Other      Red food Coloring  . Penicillins Other (See Comments)    Unknown reactions from PT    Metabolic Disorder Labs: No results found for: HGBA1C, MPG No results found for: PROLACTIN Lab Results  Component Value Date   CHOL 150 12/03/2011   TRIG 44 12/03/2011   HDL 59 12/03/2011   CHOLHDL 2.5 12/03/2011   VLDL 9 12/03/2011   LDLCALC 82 12/03/2011     Current Medications: Current Outpatient Prescriptions  Medication Sig Dispense Refill  . ALPRAZolam (XANAX) 0.25 MG tablet Take 1 tablet (0.25 mg total) by mouth 3 (three) times daily as needed for anxiety. 30 tablet 0  . FLUoxetine (PROZAC) 40 MG capsule Take 1 capsule (40 mg total) by mouth daily. 90 capsule 1  . Multiple Vitamin (MULTIVITAMIN WITH MINERALS) TABS tablet Take 1 tablet by mouth daily.    Marland Kitchen PROVENTIL HFA 108 (90 Base) MCG/ACT inhaler Inhale 2 puffs into the lungs every 4 (four) hours as needed.     No current facility-administered medications for this visit.     Neurologic: Headache: Negative Seizure: Negative Paresthesias: Negative  Musculoskeletal: Strength & Muscle Tone: within normal limits Gait & Station: normal Patient leans: N/A  Psychiatric Specialty Exam: ROS  Blood pressure 120/72, pulse 68, height 5\' 4"  (1.626 m), weight 73.7 kg (162 lb 6.4 oz).Body mass index is 27.88 kg/m.  General Appearance: Casual  Eye Contact:  Good  Speech:  Clear and Coherent  Volume:  Normal  Mood:  Euthymic  Affect:  Appropriate  Thought Process:  Coherent  Orientation:  Full (Time, Place, and Person)  Thought Content: Logical   Suicidal Thoughts:  No  Homicidal Thoughts:  No  Memory:  Immediate;   Good  Judgement:  Good  Insight:  Good  Psychomotor Activity:  Normal  Concentration:  Attention Span: Good  Recall:  Good  Fund of Knowledge: Good  Language: Good  Akathisia:  Negative  Handed:  Right  AIMS (if indicated):  na  Assets:  Communication Skills Desire for Improvement Financial  Resources/Insurance Housing Intimacy Leisure Time Physical Health Resilience Social Support Talents/Skills Transportation Vocational/Educational  ADL's:  Intact  Cognition: WNL  Sleep:  6 hours nightly, no nightmares   Treatment Plan Summary: KYLIN GENNA is a 41 year old female with PTSD, who presents today for psychiatric follow-up.  She has had an excellent response to Prozac, and would benefit from and up titration to 40 mg. We will discontinue Wellbutrin, so as to reduce some activating anxiety effects. Prazosin was poorly tolerated, so we'll discontinue this, as patient has already stopped a few weeks ago. No acute safety issues at this time. We'll continue as below and follow up in 8 weeks.  1. PTSD (post-traumatic stress disorder)   2.  Severe recurrent major depression without psychotic features ([facility])    - Discontinue Wellbutrin 150 XL as I suspect this is not compatible with her diagnosis of PTSD and is likely contributing to anxiety - Increase Prozac to 40 mg daily - Prazosin discontinued by patient, due to migraines, but her sleep has improved with Prozac - Xanax when necessary refilled-she has used it approximately 2-3 times per week - Return to clinic in 8 weeks - Continue in therapy at [facility] outpatient behavioral  Aundra Dubin, MD 12/11/2016, 9:26 AM

## 2016-12-25 ENCOUNTER — Ambulatory Visit: Payer: 59 | Admitting: Licensed Clinical Social Worker

## 2017-01-21 NOTE — Progress Notes (Signed)
Cardiology Office Note  Date:  01/22/2017   ID:  Sarah KEEVEN, DOB 05-22-1976, MRN [id number]  PCP:  No PCP Per Patient   Chief Complaint  Patient presents with  . other    OD  56yr f/u c/o rapid heart rate and panic attacks. Meds reviewed verbally with pt.    HPI:  [name] is a pleasant 41 year old woman with long history of arrhythmia dating back several years  initially with bradycardia during the pregnancy of her first son tachycardia with her second son during delivery and in recovery, acute onset of tachycardia with rates up to 120s from rest,  depression She is a smoker. 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 for routine follow-up of her arrhythmia  In follow-up she reports that she is  Having periodic panic attacks Recently seen by therapist, started on Prozac and Xanax Reports having significant tachycardia when she has these episodes heart rate up to 140 bpm, sometimes higher Symptomatic tachycardia, sometimes is not relieved with her Xanax Previously try propranolol with good effect Pills are now old  Otherwise work is going relatively well, no other significant stressors  She continues to smoke, one half pack per day  husband smokes  EKG on today's visit shows normal sinus rhythm with rate 69 bpm, no significant ST or T-wave changes  Other past medical history When she has tachycardia, she reports acute onset, acute cessation of the rhythm at termination. Typically coming on at rest She measures her heart rate by manual palpation Denies any significant chest pain with exertion. No lightheadedness or near syncope  Prior echocardiogram in 2012: Essentially normal study Left ventricle: The cavity size was normal. Wall thickness was normal. Systolic function was normal. The estimated ejection fraction was in the range of 60% to 65%. Wall motion was normal; there were no regional wall motion abnormalities. Left  ventricular diastolic function parameters were normal.   PMH:   has a past medical history of Arrhythmia; COPD (chronic obstructive pulmonary disease) ([place]) (10/26/2016); Endometriosis; Headache; History of tobacco abuse; Interstitial cystitis; Major depression (10/26/2016); Medical history non-contributory; MRSA (methicillin resistant staph aureus) culture positive (2013); Ovarian cyst; Panic disorder; Pyelonephritis; PYELONEPHRITIS (06/08/2009); and Sinus problem.  PSH:    Past Surgical History:  Procedure Laterality Date  . APPENDECTOMY    . LAPAROSCOPY    . ORIF ANKLE FRACTURE Left 09/29/2015   Procedure: OPEN REDUCTION INTERNAL FIXATION (ORIF) ANKLE FRACTURE;  Surgeon: Hessie Knows, MD;  Location: [facility] ORS;  Service: Orthopedics;  Laterality: Left;    Current Outpatient Prescriptions  Medication Sig Dispense Refill  . ALPRAZolam (XANAX) 0.25 MG tablet Take 1 tablet (0.25 mg total) by mouth 3 (three) times daily as needed for anxiety. 30 tablet 0  . FLUoxetine (PROZAC) 40 MG capsule Take 1 capsule (40 mg total) by mouth daily. 90 capsule 1  . Multiple Vitamin (MULTIVITAMIN WITH MINERALS) TABS tablet Take 1 tablet by mouth daily.    Marland Kitchen PROVENTIL HFA 108 (90 Base) MCG/ACT inhaler Inhale 2 puffs into the lungs every 4 (four) hours as needed.    . propranolol (INDERAL) 20 MG tablet Take 1 tablet (20 mg total) by mouth 3 (three) times daily as needed. 60 tablet 2   No current facility-administered medications for this visit.      Allergies:   Sulfonamide derivatives; Other; and Penicillins   Social History:  The patient  reports that she has been smoking Cigarettes.  She has a 5.50 pack-year smoking history.  She has never used smokeless tobacco. She reports that she drinks about 2.4 oz of alcohol per week . She reports that she does not use drugs.   Family History:   family history includes Leukemia in her paternal grandfather; Thyroid disease in her maternal aunt.    Review of  Systems: Review of Systems  Constitutional: Negative.   Respiratory: Negative.   Cardiovascular: Negative.   Gastrointestinal: Negative.   Musculoskeletal: Negative.   Neurological: Negative.   Psychiatric/Behavioral: The patient is nervous/anxious.   All other systems reviewed and are negative.    PHYSICAL EXAM: VS:  BP 116/70 (BP Location: Left Arm, Patient Position: Sitting, Cuff Size: Normal)   Pulse 69   Ht 5\' 4"  (1.626 m)   Wt 168 lb 8 oz (76.4 kg)   BMI 28.92 kg/m  , BMI Body mass index is 28.92 kg/m. GEN: Well nourished, well developed, in no acute distress  HEENT: normal  Neck: no JVD, carotid bruits, or masses Cardiac: RRR; no murmurs, rubs, or gallops,no edema  Respiratory:  clear to auscultation bilaterally, normal work of breathing GI: soft, nontender, nondistended, + BS MS: no deformity or atrophy  Skin: warm and dry, no rash Neuro:  Strength and sensation are intact Psych: euthymic mood, full affect    Recent Labs: 10/24/2016: ALT 16; BUN 14; Creatinine, Ser 0.79; Hemoglobin 13.6; Platelets 209; Potassium 4.4; Sodium 141    Lipid Panel Lab Results  Component Value Date   CHOL 150 12/03/2011   HDL 59 12/03/2011   LDLCALC 82 12/03/2011   TRIG 44 12/03/2011      Wt Readings from Last 3 Encounters:  01/22/17 168 lb 8 oz (76.4 kg)  07/27/16 163 lb (73.9 kg)  03/13/16 164 lb (74.4 kg)       ASSESSMENT AND PLAN:  Atrial tachycardia, paroxysmal (HCC) - Plan: EKG 12-Lead Previous history of tachycardia Good effect with propranolol as needed  Alcohol use disorder, moderate, dependence (HCC) - Plan: EKG 12-Lead Detailed in the notes  Major depressive disorder, recurrent severe without psychotic features ([place]) - Plan: EKG 12-Lead Managed by therapist Now on Prozac  Panic attacks Managed with Prozac and Xanax If symptoms persist and she is symptomatic, recommended she take propranolol as needed  Disposition:   F/U  12 months   Total encounter  time more than 25 minutes  Greater than 50% was spent in counseling and coordination of care with the patient    Orders Placed This Encounter  Procedures  . EKG 12-Lead     Signed, Esmond Plants, M.D., Ph.D. 01/22/2017  [facility] [place], [place] [phone]

## 2017-01-22 ENCOUNTER — Encounter: Payer: Self-pay | Admitting: Cardiovascular Disease

## 2017-01-22 ENCOUNTER — Ambulatory Visit (INDEPENDENT_AMBULATORY_CARE_PROVIDER_SITE_OTHER): Payer: 59 | Admitting: Cardiovascular Disease

## 2017-01-22 VITALS — BP 116/70 | HR 69 | Ht 64.0 in | Wt 168.5 lb

## 2017-01-22 DIAGNOSIS — F410 Panic disorder [episodic paroxysmal anxiety] without agoraphobia: Secondary | ICD-10-CM | POA: Insufficient documentation

## 2017-01-22 DIAGNOSIS — F332 Major depressive disorder, recurrent severe without psychotic features: Secondary | ICD-10-CM

## 2017-01-22 DIAGNOSIS — F1020 Alcohol dependence, uncomplicated: Secondary | ICD-10-CM

## 2017-01-22 DIAGNOSIS — I471 Supraventricular tachycardia: Secondary | ICD-10-CM | POA: Diagnosis not present

## 2017-01-22 MED ORDER — PROPRANOLOL HCL 20 MG PO TABS: 20.0000 mg | ORAL_TABLET | Freq: Three times a day (TID) | ORAL | 2 refills | Status: DC | PRN

## 2017-01-22 NOTE — Patient Instructions (Signed)
Medication Instructions:   Please use propranolol as needed for tachycardia  Labwork:  No new labs needed  Testing/Procedures:  No further testing at this time   I recommend watching educational videos on topics of interest to you at:       [email or web address]  Enter code: [email or web address]    Follow-Up: It was a pleasure seeing you in the office today. Please call us if you have new issues that need to be addressed before your next appt.  [phone]  Your physician wants you to follow-up in: 12 months.  You will receive a reminder letter in the mail two months in advance. If you don't receive a letter, please call our office to schedule the follow-up appointment.  If you need a refill on your cardiac medications before your next appointment, please call your pharmacy.

## 2017-02-04 ENCOUNTER — Ambulatory Visit (HOSPITAL_COMMUNITY): Payer: Self-pay | Admitting: Psychiatry

## 2017-02-04 ENCOUNTER — Other Ambulatory Visit (HOSPITAL_COMMUNITY): Payer: Self-pay

## 2017-02-04 DIAGNOSIS — F4310 Post-traumatic stress disorder, unspecified: Secondary | ICD-10-CM

## 2017-02-04 MED ORDER — ALPRAZOLAM 0.25 MG PO TABS: 0.2500 mg | ORAL_TABLET | Freq: Three times a day (TID) | ORAL | 0 refills | Status: DC | PRN

## 2017-02-04 NOTE — Progress Notes (Signed)
Patient had to be rescheduled due to doctor being out, she was due for a refill on her Xanax. I sent in a 10 day supply, patient has been rescheduled to next week.

## 2017-02-12 ENCOUNTER — Ambulatory Visit (INDEPENDENT_AMBULATORY_CARE_PROVIDER_SITE_OTHER): Payer: 59 | Admitting: Psychiatry

## 2017-02-12 ENCOUNTER — Encounter (HOSPITAL_COMMUNITY): Payer: Self-pay | Admitting: Psychiatry

## 2017-02-12 VITALS — BP 128/78 | HR 79 | Ht 64.0 in | Wt 166.0 lb

## 2017-02-12 DIAGNOSIS — F4310 Post-traumatic stress disorder, unspecified: Secondary | ICD-10-CM | POA: Diagnosis not present

## 2017-02-12 DIAGNOSIS — F332 Major depressive disorder, recurrent severe without psychotic features: Secondary | ICD-10-CM

## 2017-02-12 DIAGNOSIS — F17210 Nicotine dependence, cigarettes, uncomplicated: Secondary | ICD-10-CM

## 2017-02-12 MED ORDER — BUPROPION HCL ER (XL) 150 MG PO TB24: 150.0000 mg | ORAL_TABLET | ORAL | 1 refills | Status: DC

## 2017-02-12 MED ORDER — FLUOXETINE HCL 40 MG PO CAPS: 40.0000 mg | ORAL_CAPSULE | Freq: Every day | ORAL | 1 refills | Status: DC

## 2017-02-12 MED ORDER — ALPRAZOLAM 0.25 MG PO TABS: 0.2500 mg | ORAL_TABLET | Freq: Three times a day (TID) | ORAL | 0 refills | Status: DC | PRN

## 2017-02-12 NOTE — Progress Notes (Signed)
BH MD/PA/NP OP Progress Note  02/12/2017 9:38 AM [name]  MRN:  [id number]  Chief Complaint:  depressed  Subjective:  Carlus Pavlov reports that she has had some down and depressed mood in the setting of family stressors. Her grandmother passed away of a sudden stroke. She reports that initially Prozac helped approximately 50% with her depression, and Wellbutrin seemed to agree with that as well. She reports that when we got rid of the Wellbutrin, it seems like Prozac had a harder time doing its job.  She briefly increased Prozac to 80 mg last week, but reports that she hasn't had much benefit, but more side effects.  We discussed restarting Wellbutrin at 150 XL, and maintaining Prozac 40 mg daily. She was agreeable to this. She denies any acute safety issues. No significant stressors at home other than grandmother's recent death.  The funeral is this weekend.  Expressed my grief and condolences for the family. She agrees to follow-up in 2-3 months  Visit Diagnosis:    ICD-9-CM ICD-10-CM   1. PTSD (post-traumatic stress disorder) 309.81 F43.10 buPROPion (WELLBUTRIN XL) 150 MG 24 hr tablet     ALPRAZolam (XANAX) 0.25 MG tablet     FLUoxetine (PROZAC) 40 MG capsule  2. Severe recurrent major depression without psychotic features ([place]) 296.33 F33.2 buPROPion (WELLBUTRIN XL) 150 MG 24 hr tablet     FLUoxetine (PROZAC) 40 MG capsule    Past Psychiatric History: See intake H&P for full details. Reviewed, with no updates at this time.   Past Medical History:  Past Medical History:  Diagnosis Date  . Arrhythmia    Bradycardia  . COPD (chronic obstructive pulmonary disease) ([place]) 10/26/2016  . Endometriosis   . Headache   . History of tobacco abuse   . Interstitial cystitis   . Major depression 10/26/2016  . Medical history non-contributory   . MRSA (methicillin resistant staph aureus) culture positive 2013  . Ovarian cyst   . Panic disorder   . Pyelonephritis   .  PYELONEPHRITIS 06/08/2009   Qualifier: Diagnosis of  By: Sidney Ace    . Sinus problem    Right maxillary (frequent)    Past Surgical History:  Procedure Laterality Date  . APPENDECTOMY    . LAPAROSCOPY    . ORIF ANKLE FRACTURE Left 09/29/2015   Procedure: OPEN REDUCTION INTERNAL FIXATION (ORIF) ANKLE FRACTURE;  Surgeon: Hessie Knows, MD;  Location: [facility] ORS;  Service: Orthopedics;  Laterality: Left;    Family Psychiatric History: See intake H&P for full details. Reviewed, with no updates at this time.   Family History:  Family History  Problem Relation Age of Onset  . Leukemia Paternal Grandfather   . Thyroid disease Maternal Aunt   . Cancer Neg Hx        No obvious cancer  . Breast cancer Neg Hx     Social History:  Social History   Social History  . Marital status: Divorced    Spouse name: N/A  . Number of children: 2  . 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 Smoking status: Current Every Day Smoker    Packs/day: 0.50    Years: 11.00    Types: Cigarettes  . Smokeless tobacco: Never Used  . Alcohol use 2.4 oz/week    4 Glasses of wine per week  . Drug use: No  . Sexual activity: Yes    Partners:  Male    Birth control/ protection: , Surgical     Comment: vasectomy   Other Topics Concern  . None   Social History Narrative   Started smoking related to the tension of her first marriage and 2 children.    Allergies:  Allergies  Allergen Reactions  . Sulfonamide Derivatives Anaphylaxis  . Other     Red food Coloring  . Penicillins Other (See Comments)    Unknown reactions from PT    Metabolic Disorder Labs: No results found for: HGBA1C, MPG No results found for: PROLACTIN Lab Results  Component Value Date   CHOL 150 12/03/2011   TRIG 44 12/03/2011   HDL 59 12/03/2011   CHOLHDL 2.5 12/03/2011   VLDL 9 12/03/2011   LDLCALC 82 12/03/2011     Current  Medications: Current Outpatient Prescriptions  Medication Sig Dispense Refill  . ALPRAZolam (XANAX) 0.25 MG tablet Take 1 tablet (0.25 mg total) by mouth 3 (three) times daily as needed for anxiety. 30 tablet 0  . buPROPion (WELLBUTRIN XL) 150 MG 24 hr tablet Take 1 tablet (150 mg total) by mouth every morning. 90 tablet 1  . FLUoxetine (PROZAC) 40 MG capsule Take 1 capsule (40 mg total) by mouth daily. 90 capsule 1  . Multiple Vitamin (MULTIVITAMIN WITH MINERALS) TABS tablet Take 1 tablet by mouth daily.    . propranolol (INDERAL) 20 MG tablet Take 1 tablet (20 mg total) by mouth 3 (three) times daily as needed. 60 tablet 2  . PROVENTIL HFA 108 (90 Base) MCG/ACT inhaler Inhale 2 puffs into the lungs every 4 (four) hours as needed.     No current facility-administered medications for this visit.     Neurologic: Headache: Negative Seizure: Negative Paresthesias: Negative  Musculoskeletal: Strength & Muscle Tone: within normal limits Gait & Station: normal Patient leans: N/A  Psychiatric Specialty Exam: ROS  Blood pressure 128/78, pulse 79, height 5\' 4"  (1.626 m), weight 166 lb (75.3 kg).Body mass index is 28.49 kg/m.  General Appearance: Casual and Fairly Groomed  Eye Contact:  Good  Speech:  Clear and Coherent  Volume:  Normal  Mood:  Dysphoric  Affect:  Congruent  Thought Process:  Goal Directed  Orientation:  Full (Time, Place, and Person)  Thought Content: Logical   Suicidal Thoughts:  No  Homicidal Thoughts:  No  Memory:  Immediate;   Good  Judgement:  Good  Insight:  Good  Psychomotor Activity:  Normal  Concentration:  Concentration: Good  Recall:  Good  Fund of Knowledge: Good  Language: Good  Akathisia:  Negative  Handed:  Right  AIMS (if indicated):  0  Assets:  Communication Skills Desire for Improvement Financial Resources/Insurance Housing Leisure Time Physical Health Social Support Transportation Vocational/Educational  ADL's:  Intact  Cognition:  WNL  Sleep:  7-8 hours   Treatment Plan Summary: BETSAIDA MISSOURI is a 41 year old female with a history of major depressive disorder and PTSD who presents today for psychiatric follow-up.  She has had stressors from the passing of her grandmother, and has noted some decline in her mood with the absence of Wellbutrin. We agreed to restart Wellbutrin 150 XL, in addition to 40 mg of Prozac. No acute safety issues and we will follow-up in 3 months.  1. PTSD (post-traumatic stress disorder)   2. Severe recurrent major depression without psychotic features (HCC)    Continue Prozac 40 mg daily Restart Wellbutrin 150 mg XL Xanax 0.25 mg daily when necessary, she can use up  to 3 times daily, but should not be doing this on any sort of regular basis, prescribed 30 tablets today Follow-up in 3 months   Aundra Dubin, MD 02/12/2017, 9:38 AM

## 2017-03-18 ENCOUNTER — Telehealth (HOSPITAL_COMMUNITY): Payer: Self-pay

## 2017-03-18 DIAGNOSIS — F4310 Post-traumatic stress disorder, unspecified: Secondary | ICD-10-CM

## 2017-03-18 MED ORDER — ALPRAZOLAM 0.25 MG PO TABS: 0.2500 mg | ORAL_TABLET | Freq: Every day | ORAL | 0 refills | Status: DC

## 2017-03-18 NOTE — Telephone Encounter (Signed)
Fax received from the pharmacy for a refill on Alprazolam .25 mg. Patient is due for refill, I wanted to confirm sig and quantity. It was last given with the directions to take up to 3 times a day as needed for anxiety with a quantity of 30. Is that how you would like it sent in again? Please review and advise, thank you

## 2017-03-18 NOTE — Telephone Encounter (Signed)
Yes please, she should not be using 3 times a day on any regular basis, so 30 tablets should last the month

## 2017-03-18 NOTE — Telephone Encounter (Signed)
Okay, prescription called into the pharmacy

## 2017-04-19 ENCOUNTER — Ambulatory Visit (INDEPENDENT_AMBULATORY_CARE_PROVIDER_SITE_OTHER): Payer: 59 | Admitting: Psychiatry

## 2017-04-19 ENCOUNTER — Encounter (HOSPITAL_COMMUNITY): Payer: Self-pay | Admitting: Psychiatry

## 2017-04-19 VITALS — BP 140/82 | HR 87 | Ht 64.0 in | Wt 174.2 lb

## 2017-04-19 DIAGNOSIS — F332 Major depressive disorder, recurrent severe without psychotic features: Secondary | ICD-10-CM

## 2017-04-19 DIAGNOSIS — F4310 Post-traumatic stress disorder, unspecified: Secondary | ICD-10-CM | POA: Diagnosis not present

## 2017-04-19 DIAGNOSIS — F17210 Nicotine dependence, cigarettes, uncomplicated: Secondary | ICD-10-CM | POA: Diagnosis not present

## 2017-04-19 MED ORDER — ALPRAZOLAM 0.25 MG PO TABS: 0.2500 mg | ORAL_TABLET | Freq: Every day | ORAL | 1 refills | Status: AC

## 2017-04-19 MED ORDER — FLUOXETINE HCL 60 MG PO TABS: 60.0000 mg | ORAL_TABLET | Freq: Every day | ORAL | 1 refills | Status: DC

## 2017-04-19 NOTE — Progress Notes (Signed)
[facility] MD/PA/NP OP Progress Note  04/19/2017 3:04 PM [name]  MRN:  [id number]  Chief Complaint:  depressed  Subjective:  [name] presents today for med management follow-up. She reports that the Prozac 40 mg did seem to help her a little bit, but then plateaued in terms of its effects. She reports that she is also been dealing with stressors related to moving, and feeling bad that this would affect her children's school district, but they would have to switch schools. Their new school would actually be better, but she feels guilty about them being further away from their friends. She reports that she has had lower motivation levels, felt more tired, engaging in stress eating. discussed increasing Prozac to 60 mg daily, and continuing Wellbutrin as prescribed. I encouraged her to use Xanax only as needed for panic episodes, and try to avoid using this on a daily basis for anxiety symptoms. She admits that she has not been to therapy for many months, and needs to get back into this, and hopes to get started again in the fall and the kids go to school.  Visit Diagnosis:    ICD-10-CM   1. PTSD (post-traumatic stress disorder) F43.10 FLUoxetine 60 MG TABS    ALPRAZolam (XANAX) 0.25 MG tablet  2. Severe recurrent major depression without psychotic features (HCC) F33.2 FLUoxetine 60 MG TABS    Past Psychiatric History: See intake H&P for full details. Reviewed, with no updates at this time.   Past Medical History:  Past Medical History:  Diagnosis Date  . Arrhythmia    Bradycardia  . COPD (chronic obstructive pulmonary disease) ([place]) 10/26/2016  . Endometriosis   . Headache   . History of tobacco abuse   . Interstitial cystitis   . Major depression 10/26/2016  . Medical history non-contributory   . MRSA (methicillin resistant staph aureus) culture positive 2013  . Ovarian cyst   . Panic disorder   . Pyelonephritis   . PYELONEPHRITIS 06/08/2009   Qualifier: Diagnosis of  By:  Sidney Ace    . Sinus problem    Right maxillary (frequent)    Past Surgical History:  Procedure Laterality Date  . APPENDECTOMY    . LAPAROSCOPY    . ORIF ANKLE FRACTURE Left 09/29/2015   Procedure: OPEN REDUCTION INTERNAL FIXATION (ORIF) ANKLE FRACTURE;  Surgeon: Hessie Knows, MD;  Location: [facility] ORS;  Service: Orthopedics;  Laterality: Left;    Family Psychiatric History: See intake H&P for full details. Reviewed, with no updates at this time.   Family History:  Family History  Problem Relation Age of Onset  . Leukemia Paternal Grandfather   . Thyroid disease Maternal Aunt   . Cancer Neg Hx        No obvious cancer  . Breast cancer Neg Hx     Social History:  Social History   Social History  . Marital status: Divorced    Spouse name: N/A  . Number of children: 2  . 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 Smoking status: Current Every Day Smoker    Packs/day: 0.50    Years: 11.00    Types: Cigarettes  . Smokeless tobacco: Never Used  . Alcohol use 2.4 oz/week    4 Glasses of wine per week  . Drug use: No  . Sexual activity: Yes    Partners: Male    Birth control/ protection: ,  Surgical, Injection     Comment: vasectomy   Other Topics Concern  . None   Social History Narrative   Started smoking related to the tension of her first marriage and 2 children.    Allergies:  Allergies  Allergen Reactions  . Sulfonamide Derivatives Anaphylaxis  . Other     Red food Coloring  . Penicillins Other (See Comments)    Unknown reactions from PT    Metabolic Disorder Labs: No results found for: HGBA1C, MPG No results found for: PROLACTIN Lab Results  Component Value Date   CHOL 150 12/03/2011   TRIG 44 12/03/2011   HDL 59 12/03/2011   CHOLHDL 2.5 12/03/2011   VLDL 9 12/03/2011   LDLCALC 82 12/03/2011     Current Medications: Current Outpatient Prescriptions   Medication Sig Dispense Refill  . ALPRAZolam (XANAX) 0.25 MG tablet Take 1 tablet (0.25 mg total) by mouth daily. AS NEEDED for anxiety 30 tablet 1  . buPROPion (WELLBUTRIN XL) 150 MG 24 hr tablet Take 1 tablet (150 mg total) by mouth every morning. 90 tablet 1  . Multiple Vitamin (MULTIVITAMIN WITH MINERALS) TABS tablet Take 1 tablet by mouth daily.    . propranolol (INDERAL) 20 MG tablet Take 1 tablet (20 mg total) by mouth 3 (three) times daily as needed. 60 tablet 2  . FLUoxetine 60 MG TABS Take 60 mg by mouth daily. 90 tablet 1  . PROVENTIL HFA 108 (90 Base) MCG/ACT inhaler Inhale 2 puffs into the lungs every 4 (four) hours as needed.     No current facility-administered medications for this visit.     Neurologic: Headache: Negative Seizure: Negative Paresthesias: Negative  Musculoskeletal: Strength & Muscle Tone: within normal limits Gait & Station: normal Patient leans: N/A  Psychiatric Specialty Exam: ROS  Blood pressure 140/82, pulse 87, height 5\' 4"  (1.626 m), weight 174 lb 3.2 oz (79 kg).Body mass index is 29.9 kg/m.  General Appearance: Casual and Fairly Groomed  Eye Contact:  Good  Speech:  Clear and Coherent  Volume:  Normal  Mood:  Dysphoric  Affect:  Congruent  Thought Process:  Goal Directed  Orientation:  Full (Time, Place, and Person)  Thought Content: Logical   Suicidal Thoughts:  No  Homicidal Thoughts:  No  Memory:  Immediate;   Good  Judgement:  Good  Insight:  Good  Psychomotor Activity:  Normal  Concentration:  Concentration: Good  Recall:  Good  Fund of Knowledge: Good  Language: Good  Akathisia:  Negative  Handed:  Right  AIMS (if indicated):  0  Assets:  Communication Skills Desire for Improvement Financial Resources/Insurance Housing Leisure Time Physical Health Social Support Transportation Vocational/Educational  ADL's:  Intact  Cognition: WNL  Sleep:  7-8 hours   Treatment Plan Summary: [name] is a 41 year old  female with a history of major depressive disorder and PTSD who presents today for psychiatric follow-up.  She would benefit from an increase in Prozac to 60 mg daily, and to reengage in individual therapy. She is agreeable to continue in therapy in the fall when her schedule allows for this.  1. PTSD (post-traumatic stress disorder)   2. Severe recurrent major depression without psychotic features (HCC)    Increase Prozac to 60 mg daily Continue Wellbutrin 150 mg XL Xanax 0.25 mg daily when necessary, she can use up to 3 times daily, but should not be doing this on any sort of regular basis, prescribed 30 tablets today Follow-up in 1 month  Aundra Dubin, MD 04/19/2017, 3:04 PM

## 2017-04-19 NOTE — Patient Instructions (Signed)
Checkout the mental health association of [place]  MHAG.[email or web address]  Free group therapies, education seminars

## 2017-05-19 ENCOUNTER — Ambulatory Visit (HOSPITAL_COMMUNITY): Payer: Self-pay | Admitting: Psychiatry

## 2017-07-09 ENCOUNTER — Ambulatory Visit (HOSPITAL_COMMUNITY): Payer: Self-pay | Admitting: Psychiatry

## 2017-10-06 ENCOUNTER — Telehealth (HOSPITAL_COMMUNITY): Payer: Self-pay

## 2017-10-06 DIAGNOSIS — F332 Major depressive disorder, recurrent severe without psychotic features: Secondary | ICD-10-CM

## 2017-10-06 DIAGNOSIS — F4310 Post-traumatic stress disorder, unspecified: Secondary | ICD-10-CM

## 2017-10-06 NOTE — Telephone Encounter (Signed)
Okay to send 30 days, she should schedule a follow-up with her PCP to continue on the medicine, and schedule a follow-up with [place] as soon as possible if she wants to continue in care here.  She lives in [place] which may be part of the inconvenience for her

## 2017-10-06 NOTE — Telephone Encounter (Signed)
Medication refill request - Fax received from pt's [facility] for refill of prescribed Bupropion XL 150 mg, last ordered for 90 days 02/12/17 + 1 refill. Pt. last seen 04/19/17 with no current appt. scheduled as no showed 07/09/17 and cancelled 05/19/17

## 2017-10-12 MED ORDER — BUPROPION HCL ER (XL) 150 MG PO TB24: 150.0000 mg | ORAL_TABLET | ORAL | 0 refills | Status: DC

## 2017-10-12 NOTE — Telephone Encounter (Signed)
A new 30 day Wellbutrin XL 150 mg, one a day with no refils order e-scribed to patient's [facility] in [place], [place] per Dr. Daron Offer approval with notation of no further refills until patient evaluated or patient can follow up with PCP for further medication orders.

## 2018-01-07 ENCOUNTER — Ambulatory Visit: Payer: Self-pay | Admitting: Family Medicine

## 2018-03-04 ENCOUNTER — Ambulatory Visit: Payer: Self-pay | Admitting: Family Medicine

## 2018-04-04 ENCOUNTER — Emergency Department
Admission: EM | Admit: 2018-04-04 | Discharge: 2018-04-05 | Disposition: A | Discharge status: Home / Self Care | Payer: 59 | Attending: Emergency Medicine | Admitting: Emergency Medicine

## 2018-04-04 ENCOUNTER — Other Ambulatory Visit: Payer: Self-pay

## 2018-04-04 DIAGNOSIS — F329 Major depressive disorder, single episode, unspecified: Secondary | ICD-10-CM | POA: Insufficient documentation

## 2018-04-04 DIAGNOSIS — F332 Major depressive disorder, recurrent severe without psychotic features: Secondary | ICD-10-CM | POA: Diagnosis present

## 2018-04-04 DIAGNOSIS — F1020 Alcohol dependence, uncomplicated: Secondary | ICD-10-CM | POA: Diagnosis present

## 2018-04-04 DIAGNOSIS — F1994 Other psychoactive substance use, unspecified with psychoactive substance-induced mood disorder: Secondary | ICD-10-CM

## 2018-04-04 DIAGNOSIS — F10920 Alcohol use, unspecified with intoxication, uncomplicated: Secondary | ICD-10-CM

## 2018-04-04 DIAGNOSIS — F419 Anxiety disorder, unspecified: Secondary | ICD-10-CM | POA: Insufficient documentation

## 2018-04-04 DIAGNOSIS — F4310 Post-traumatic stress disorder, unspecified: Secondary | ICD-10-CM | POA: Insufficient documentation

## 2018-04-04 DIAGNOSIS — R45851 Suicidal ideations: Secondary | ICD-10-CM | POA: Insufficient documentation

## 2018-04-04 DIAGNOSIS — F17210 Nicotine dependence, cigarettes, uncomplicated: Secondary | ICD-10-CM | POA: Insufficient documentation

## 2018-04-04 DIAGNOSIS — F10229 Alcohol dependence with intoxication, unspecified: Secondary | ICD-10-CM | POA: Insufficient documentation

## 2018-04-04 DIAGNOSIS — Z79899 Other long term (current) drug therapy: Secondary | ICD-10-CM | POA: Insufficient documentation

## 2018-04-04 DIAGNOSIS — J449 Chronic obstructive pulmonary disease, unspecified: Secondary | ICD-10-CM | POA: Insufficient documentation

## 2018-04-04 LAB — CBC
HCT: 42.5 % (ref 35.0–47.0)
HEMOGLOBIN: 14.9 g/dL (ref 12.0–16.0)
MCH: 34 pg (ref 26.0–34.0)
MCHC: 35.1 g/dL (ref 32.0–36.0)
MCV: 97.1 fL (ref 80.0–100.0)
Platelets: 229 10*3/uL (ref 150–440)
RBC: 4.37 MIL/uL (ref 3.80–5.20)
RDW: 12.8 % (ref 11.5–14.5)
WBC: 4.6 10*3/uL (ref 3.6–11.0)

## 2018-04-04 LAB — URINE DRUG SCREEN, QUALITATIVE (ARMC ONLY)
Amphetamines, Ur Screen: NOT DETECTED
Benzodiazepine, Ur Scrn: NOT DETECTED
CANNABINOID 50 NG, UR ~~LOC~~: NOT DETECTED
Cocaine Metabolite,Ur ~~LOC~~: NOT DETECTED
MDMA (Ecstasy)Ur Screen: NOT DETECTED
METHADONE SCREEN, URINE: NOT DETECTED
Opiate, Ur Screen: NOT DETECTED
Phencyclidine (PCP) Ur S: NOT DETECTED
TRICYCLIC, UR SCREEN: NOT DETECTED

## 2018-04-04 LAB — COMPREHENSIVE METABOLIC PANEL
ALK PHOS: 75 U/L (ref 38–126)
ALT: 45 U/L — AB (ref 0–44)
ANION GAP: 13 (ref 5–15)
AST: 41 U/L (ref 15–41)
Albumin: 4.1 g/dL (ref 3.5–5.0)
BUN: 8 mg/dL (ref 6–20)
CALCIUM: 8.8 mg/dL — AB (ref 8.9–10.3)
CO2: 18 mmol/L — AB (ref 22–32)
CREATININE: 0.67 mg/dL (ref 0.44–1.00)
Chloride: 112 mmol/L — ABNORMAL HIGH (ref 98–111)
Glucose, Bld: 98 mg/dL (ref 70–99)
Potassium: 3.7 mmol/L (ref 3.5–5.1)
SODIUM: 143 mmol/L (ref 135–145)
Total Bilirubin: 0.4 mg/dL (ref 0.3–1.2)
Total Protein: 7.5 g/dL (ref 6.5–8.1)

## 2018-04-04 LAB — ETHANOL: ALCOHOL ETHYL (B): 166 mg/dL — AB (ref <10)

## 2018-04-04 LAB — SALICYLATE LEVEL

## 2018-04-04 LAB — ACETAMINOPHEN LEVEL

## 2018-04-04 MED ORDER — OXYMETAZOLINE HCL 0.05 % NA SOLN
1.0000 | Freq: Once | NASAL | Status: AC
  Administered 2018-04-04: 1 via NASAL
  Filled 2018-04-04: qty 15

## 2018-04-04 NOTE — ED Notes (Signed)
Hourly rounding reveals patient in 20 hall. No complaints, stable, in no acute distress. Q15 minute rounds and monitoring via Engineer, drilling to continue.

## 2018-04-04 NOTE — ED Notes (Signed)
Report to include Situation, Background, Assessment, and Recommendations received from [facility]. Patient alert and oriented, warm and dry, in no acute distress. Patient denies SI, HI, AVH and pain. Patient made aware of Q15 minute rounds and Engineer, drilling presence for their safety. Patient instructed to come to me with needs or concerns.

## 2018-04-04 NOTE — ED Notes (Signed)
Urine Preg is Negative

## 2018-04-04 NOTE — ED Notes (Signed)
Hourly rounding reveals patient sleeping in 20 hall. No complaints, stable, in no acute distress. Q15 minute rounds and monitoring via Engineer, drilling to continue.

## 2018-04-04 NOTE — ED Notes (Signed)
Pt. Informed of the [facility] recommendation for inpatient admission.

## 2018-04-04 NOTE — ED Notes (Signed)
Hourly rounding reveals patient in room 23 talking SOC. No complaints, stable, in no acute distress. Q15 minute rounds and monitoring via Engineer, drilling to continue.

## 2018-04-04 NOTE — ED Notes (Signed)
Pt was grey shorts, tannish t-shirt, white socks, crocs, blue underwear.   Denies jewelry other than ring which she states will not come off d/t swelling (states it didn't come off last time she was seen for same), denies cell phone.   Changed out by this RN, Dorian EDT, Officer Boggs.

## 2018-04-04 NOTE — ED Triage Notes (Signed)
Pt states "fuck if I know why I'm here." states she got drunk on Saturday, said some things to family. Family came to house today. Pt arrives IVC. Family told PD that pt threatened to shoot family and self. Pt states "I don't have a fucking gun." pt crying in triage. Alert, oriented, ambulatory. Pt denies SI, states "i'd smack my sister in the fucking face" when asked HI. Pt states "I'm pissed off." pt states last ETOH was Saturday, denies drugs. Pt states when she was seen last she was prescribed medication but states that her boyfriend doesn't believe in medication and wouldn't let her take it so she thinks being here isn't going to help her.

## 2018-04-04 NOTE — ED Notes (Signed)
Patient given water per request by this EDT.
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
## 2018-04-04 NOTE — ED Provider Notes (Signed)
[facility] Emergency Department Provider Note  ____________________________________________  Time seen: Approximately 6:23 PM  I have reviewed the triage vital signs and the nursing notes.   HISTORY  Chief Complaint Psychiatric Evaluation   HPI [name] is a 42 y.o. female with a history of depression, anxiety, PTSD who presents IVC by her boyfriend for suicidal ideation.  According to the IVC papers, " patient is going from normal to irate and short time, off meds, yells, screams, then calms and goes to bed".  Patient lives at home with her boyfriend and 4 children.  According to patient 2 days ago she was drinking alcohol and made some suicidal threats.  She denies ever trying to commit suicide but has had suicidal thoughts in the past usually when she drinks.  Today she got into an argument with her mom and sister who then called her boyfriend and her boyfriend IVC patient.  Patient denies making suicidal threats for the last 2 days.  She denies suicidal ideation.  She reports that she has not been on her medications because her boyfriend throws all of her medications out.  She denies any other drug use.  She reports right now that she is just very angry.  She denies being depressed.  Chief Complaint: psychiatric evaluation Severity: mild Duration: few days Context: drinking alcohol Modifying factors: not taking meds Associated signs/symptoms: denies SI or HI    Past Medical History:  Diagnosis Date  . Arrhythmia    Bradycardia  . COPD (chronic obstructive pulmonary disease) ([place]) 10/26/2016  . Endometriosis   . Headache   . History of tobacco abuse   . Interstitial cystitis   . Major depression 10/26/2016  . Medical history non-contributory   . MRSA (methicillin resistant staph aureus) culture positive 2013  . Ovarian cyst   . Panic disorder   . Pyelonephritis   . PYELONEPHRITIS 06/08/2009   Qualifier: Diagnosis of  By: Sidney Ace      . Sinus problem    Right maxillary (frequent)    Patient Active Problem List   Diagnosis Date Noted  . Panic attacks 01/22/2017  . 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 Alcohol use disorder, moderate, dependence ([place]) 10/26/2016  . COPD (chronic obstructive pulmonary disease) ([place]) 10/26/2016  . Vaginal dryness 03/14/2016  . Trimalleolar fracture of ankle, closed 09/28/2015  . Atrial tachycardia ([place]) 08/09/2015  . Atrial tachycardia, paroxysmal ([place]) 01/05/2014  . Obesity (BMI 30-39.9) 12/19/2013  . Migraine without aura 12/29/2011  . Anxiety 12/29/2011  . Insomnia 12/29/2011  . Tobacco use disorder 10/08/2010  . BRADYCARDIA 06/12/2009  . CAROTID BRUIT 06/12/2009  . INTERSTITIAL CYSTITIS 06/08/2009  . ENDOMETRIOSIS 06/08/2009  . PANIC DISORDER, HX OF 06/08/2009  . TOBACCO ABUSE, HX OF 06/08/2009    Past Surgical History:  Procedure Laterality Date  . APPENDECTOMY    . LAPAROSCOPY    . ORIF ANKLE FRACTURE Left 09/29/2015   Procedure: OPEN REDUCTION INTERNAL FIXATION (ORIF) ANKLE FRACTURE;  Surgeon: Hessie Knows, MD;  Location: [facility] ORS;  Service: Orthopedics;  Laterality: Left;    Prior to Admission medications   Medication Sig Start Date End Date Taking? Authorizing Provider  ALPRAZolam (XANAX) 0.25 MG tablet Take 1 tablet (0.25 mg total) by mouth daily. AS NEEDED for anxiety 04/19/17 04/19/18  Aundra Dubin, MD  buPROPion (WELLBUTRIN XL) 150 MG 24 hr tablet Take 1 tablet (150 mg total) by mouth every morning. 10/12/17 10/12/18  Eksir,  Richard Miu, MD  FLUoxetine 60 MG TABS Take 60 mg by mouth daily. 04/19/17   Aundra Dubin, MD  Multiple Vitamin (MULTIVITAMIN WITH MINERALS) TABS tablet Take 1 tablet by mouth daily.    [provider]  propranolol (INDERAL) 20 MG tablet Take 1 tablet (20 mg total) by mouth 3 (three) times daily as needed. 01/22/17   Minna Merritts, MD  PROVENTIL HFA 108 [phone] [place])  MCG/ACT inhaler Inhale 2 puffs into the lungs every 4 (four) hours as needed. 10/01/16   [provider]    Allergies Sulfonamide derivatives; Other; and Penicillins  Family History  Problem Relation Age of Onset  . Leukemia Paternal Grandfather   . Thyroid disease Maternal Aunt   . Cancer Neg Hx        No obvious cancer  . Breast cancer Neg Hx     Social History Social History   Tobacco Use  . Smoking status: Current Every Day Smoker    Packs/day: 0.50    Years: 11.00    Pack years: 5.50    Types: Cigarettes  . Smokeless tobacco: Never Used  Substance Use Topics  . Alcohol use: Yes    Alcohol/week: 2.4 oz    Types: 4 Glasses of wine per week  . Drug use: No    Review of Systems  Constitutional: Negative for fever. Eyes: Negative for visual changes. ENT: Negative for sore throat. Neck: No neck pain  Cardiovascular: Negative for chest pain. Respiratory: Negative for shortness of breath. Gastrointestinal: Negative for abdominal pain, vomiting or diarrhea. Genitourinary: Negative for dysuria. Musculoskeletal: Negative for back pain. Skin: Negative for rash. Neurological: Negative for headaches, weakness or numbness. Psych: No SI or HI  ____________________________________________   PHYSICAL EXAM:  VITAL SIGNS: ED Triage Vitals [04/04/18 1740]  Enc Vitals Group     BP (!) 143/80     Pulse Rate (!) 138     Resp 18     Temp 98.5 F (36.9 C)     Temp Source Oral     SpO2 99 %     Weight 195 lb (88.5 kg)     Height 5\' 4"  (1.626 m)     Head Circumference      Peak Flow      Pain Score 0     Pain Loc      Pain Edu?      Excl. in [place]?     Constitutional: Alert and oriented, crying upset.  HEENT:      Head: Normocephalic and atraumatic.         Eyes: Conjunctivae are normal. Sclera is non-icteric.       Mouth/Throat: Mucous membranes are moist.       Neck: Supple with no signs of meningismus. Cardiovascular: Tachycardic with regular rhythm. No  murmurs, gallops, or rubs. 2+ symmetrical distal pulses are present in all extremities. No JVD. Respiratory: Normal respiratory effort. Lungs are clear to auscultation bilaterally. No wheezes, crackles, or rhonchi.  Gastrointestinal: Soft, non tender, and non distended with positive bowel sounds. No rebound or guarding. Musculoskeletal: Nontender with normal range of motion in all extremities. No edema, cyanosis, or erythema of extremities. Neurologic: Normal speech and language. Face is symmetric. Moving all extremities. No gross focal neurologic deficits are appreciated. Skin: Skin is warm, dry and intact. No rash noted. Psychiatric: Mood and affect are depressed. Speech and behavior are normal.  ____________________________________________   LABS (all labs ordered are listed, but only abnormal results are  displayed)  Labs Reviewed  COMPREHENSIVE METABOLIC PANEL - Abnormal; Notable for the following components:      Result Value   Chloride 112 (*)    CO2 18 (*)    Calcium 8.8 (*)    ALT 45 (*)    All other components within normal limits  ETHANOL - Abnormal; Notable for the following components:   Alcohol, Ethyl (B) 166 (*)    All other components within normal limits  ACETAMINOPHEN LEVEL - Abnormal; Notable for the following components:   Acetaminophen (Tylenol), Serum <10 (*)    All other components within normal limits  URINE DRUG SCREEN, QUALITATIVE ([facility] ONLY) - Abnormal; Notable for the following components:   Barbiturates, Ur Screen   (*)    Value: Result not available. Reagent lot number recalled by manufacturer.   All other components within normal limits  SALICYLATE LEVEL  CBC  POC URINE PREG, ED   ____________________________________________  EKG  none  ____________________________________________  RADIOLOGY  none  ____________________________________________   PROCEDURES  Procedure(s) performed: None Procedures Critical Care performed:   None ____________________________________________   INITIAL IMPRESSION / ASSESSMENT AND PLAN / ED COURSE  42 y.o. female with a history of depression, anxiety, PTSD who presents IVC by her boyfriend for suicidal ideation.  Patient denies any suicidal ideation.  Psychiatry has been consulted for clearance of IVC.  Labs show alcohol level of 166, no other acute findings.  Patient reports history of tachycardia and is supposed to be on medicine for it but her boyfriend throws it away and she does not take it.  She has no chest pain.      As part of my medical decision making, I reviewed the following data within the [place] notes reviewed and incorporated, Labs reviewed , Old chart reviewed, A consult was requested and obtained from this/these consultant(s) Psychiatry, Notes from prior ED visits and [place] Controlled Substance Database    Pertinent labs & imaging results that were available during my care of the patient were reviewed by me and considered in my medical decision making (see chart for details).    ____________________________________________   FINAL CLINICAL IMPRESSION(S) / ED DIAGNOSES  Final diagnoses:  Alcoholic intoxication without complication ([place])  Suicidal ideation      NEW MEDICATIONS STARTED DURING THIS VISIT:  ED Discharge Orders    None       Note:  This document was prepared using Dragon voice recognition software and may include unintentional dictation errors.    Rudene Re, MD 04/04/18 2201
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
## 2018-04-05 ENCOUNTER — Other Ambulatory Visit: Payer: Self-pay | Admitting: Psychiatry

## 2018-04-05 DIAGNOSIS — F332 Major depressive disorder, recurrent severe without psychotic features: Secondary | ICD-10-CM

## 2018-04-05 DIAGNOSIS — F1994 Other psychoactive substance use, unspecified with psychoactive substance-induced mood disorder: Secondary | ICD-10-CM

## 2018-04-05 DIAGNOSIS — F4310 Post-traumatic stress disorder, unspecified: Secondary | ICD-10-CM

## 2018-04-05 MED ORDER — BUPROPION HCL ER (XL) 150 MG PO TB24: 150.0000 mg | ORAL_TABLET | ORAL | 1 refills | Status: DC

## 2018-04-05 NOTE — ED Notes (Signed)
IVC rescinded/Pt D/C'd

## 2018-04-05 NOTE — ED Notes (Signed)
Pt given all her belongings. Given prescription, discharge teaching done and pt verbalized understanding. Escorted to lobby in NAD, ambulatory.

## 2018-04-05 NOTE — Consult Note (Signed)
Psychiatry: Brief note.  Full note to follow.  Patient is being taken off of involuntary commitment and may be discharged home with local outpatient follow-up

## 2018-04-05 NOTE — ED Notes (Signed)
Hourly rounding reveals patient sleeping in hall 20. No complaints, stable, in no acute distress. Q15 minute rounds and monitoring via Engineer, drilling to continue.

## 2018-04-05 NOTE — ED Notes (Signed)
Pt signed paper discharge form 

## 2018-04-05 NOTE — Consult Note (Signed)
[facility] Consult   Reason for Consult: Consult for this 42 year old woman with a history of alcohol abuse and depression who comes into the hospital on commitment papers Referring Physician: Jimmye Norman Patient Identification: [name] MRN:  [id number] Principal Diagnosis: Substance induced mood disorder [facility]) Diagnosis:   Patient Active Problem List   Diagnosis Date Noted  . Substance induced mood disorder ([place]) [F19.94] 04/05/2018  . Panic attacks [F41.0] 01/22/2017  . Major depressive disorder, recurrent severe without psychotic features ([place]) [F33.2] 10/26/2016  . Alcohol use disorder, moderate, dependence ([place]) [F10.20] 10/26/2016  . COPD (chronic obstructive pulmonary disease) ([place]) [J44.9] 10/26/2016  . Vaginal dryness [N89.8] 03/14/2016  . Trimalleolar fracture of ankle, closed [S82.853A] 09/28/2015  . Atrial tachycardia ([place]) [I47.1] 08/09/2015  . Atrial tachycardia, paroxysmal ([place]) [I47.1] 01/05/2014  . Obesity (BMI 30-39.9) [E66.9] 12/19/2013  . Migraine without aura [346.1] 12/29/2011  . Anxiety [F41.9] 12/29/2011  . Insomnia [G47.00] 12/29/2011  . Tobacco use disorder [F17.200] 10/08/2010  . BRADYCARDIA [I49.8] 06/12/2009  . CAROTID BRUIT [R09.89] 06/12/2009  . INTERSTITIAL CYSTITIS [N30.10] 06/08/2009  . ENDOMETRIOSIS [N80.9] 06/08/2009  . PANIC DISORDER, HX OF [Z86.59] 06/08/2009  . TOBACCO ABUSE, HX OF [Z87.891] 06/08/2009    Total Time spent with patient: 1 hour  Subjective:   [name] is a 42 y.o. female patient admitted with "I drank too much and apparently then I threatened to kill myself".  HPI: Patient seen chart reviewed.  Patient cooperative with the interview.  She says that last Saturday she drank too much and apparently made comments that she does not remember about threatening to kill herself.  Today her mother and her sister came over to her house and escalated an argument.  During that time she got more agitated and  eventually they took out commitment papers.  Patient admits that she had been drinking at the time of the argument as well although she minimizes the amount.  Mood has been depressed but the patient denies having any actual wish or intent to die.  Does not sleep too badly.  Appetite okay.  Denies suicidal thoughts denies psychosis.  Does feel depressed and down and anxious much of the time.  Admits that she is drinking too much.  Denies other drug use.  Medical history: No significant ongoing medical problems  Social history: Patient lives at home with her boyfriend and has 4 children.  Not currently working outside the home.  Feels depressed about losing her job.  Substance abuse history: History of recurrent alcohol abuse.  No history of delirium tremens or seizures.  Past Psychiatric History: Patient has been seen in [place] by the outpatient clinic but stopped going several months ago.  She was on Wellbutrin and Prozac and believes the Wellbutrin was more helpful for her.  Denies ever having actually tried to kill herself in the past  Risk to Self: Suicidal Ideation: No Suicidal Intent: No Is patient at risk for suicide?: No Suicidal Plan?: No Access to Means: No What has been your use of drugs/alcohol within the last 12 months?: occassional use of alcohol How many times?: 0 Other Self Harm Risks: denied Triggers for Past Attempts: None known Intentional Self Injurious Behavior: None Risk to Others: Homicidal Ideation: No Thoughts of Harm to Others: No Current Homicidal Intent: No Current Homicidal Plan: No Access to Homicidal Means: No Identified Victim: none identified History of harm to others?: No Assessment of Violence: None Noted Violent Behavior Description: denied Does patient have access to  weapons?: No Criminal Charges Pending?: No Does patient have a court date: No Prior Inpatient Therapy: Prior Inpatient Therapy: Yes Prior Therapy Dates: 2018 Prior Therapy  Facilty[facility]Provider[facility]): [facility] Reason for Treatment: Depression Prior Outpatient Therapy: Prior Outpatient Therapy: Yes Prior Therapy Dates: 2018 Prior Therapy Facilty/Provider(s): [facility] Reason for Treatment: PTSD Does patient have an ACCT team?: No Does patient have Intensive In-House Services?  : No Does patient have [facility] services? : No Does patient have P4CC services?: No  Past Medical History:  Past Medical History:  Diagnosis Date  . Arrhythmia    Bradycardia  . COPD (chronic obstructive pulmonary disease) ([place]) 10/26/2016  . Endometriosis   . Headache   . History of tobacco abuse   . Interstitial cystitis   . Major depression 10/26/2016  . Medical history non-contributory   . MRSA (methicillin resistant staph aureus) culture positive 2013  . Ovarian cyst   . Panic disorder   . Pyelonephritis   . PYELONEPHRITIS 06/08/2009   Qualifier: Diagnosis of  By: Sidney Ace    . Sinus problem    Right maxillary (frequent)    Past Surgical History:  Procedure Laterality Date  . APPENDECTOMY    . LAPAROSCOPY    . ORIF ANKLE FRACTURE Left 09/29/2015   Procedure: OPEN REDUCTION INTERNAL FIXATION (ORIF) ANKLE FRACTURE;  Surgeon: Hessie Knows, MD;  Location: [facility] ORS;  Service: Orthopedics;  Laterality: Left;   Family History:  Family History  Problem Relation Age of Onset  . Leukemia Paternal Grandfather   . Thyroid disease Maternal Aunt   . Cancer Neg Hx        No obvious cancer  . Breast cancer Neg Hx    Family Psychiatric  History: None known Social History:  Social History   Substance and Sexual Activity  Alcohol Use Yes  . Alcohol/week: 2.4 oz  . Types: 4 Glasses of wine per week     Social History   Substance and Sexual Activity  Drug Use No    Social History   Socioeconomic History  . Marital status: Divorced    Spouse name: Not on file  . Number of children: 2  . Years of education: Not on file  . Highest education level: Not on file  Occupational  History  . Occupation: Set designer: [employer]  Social Needs  . Financial resource strain: Not on file  . Food insecurity:    Worry: Not on file    Inability: Not on file  . Transportation needs:    Medical: Not on file    Non-medical: Not on file  Tobacco Use  . Smoking status: Current Every Day Smoker    Packs/day: 0.50    Years: 11.00    Pack years: 5.50    Types: Cigarettes  . Smokeless tobacco: Never Used  Substance and Sexual Activity  . Alcohol use: Yes    Alcohol/week: 2.4 oz    Types: 4 Glasses of wine per week  . Drug use: No  . Sexual activity: Yes    Partners: Male    Birth control/protection: Surgical, Injection    Comment: vasectomy  Lifestyle  . Physical activity:    Days per week: Not on file    Minutes per session: Not on file  . Stress: Not on file  Relationships  . Social connections:    Talks on phone: Not on file    Gets together: Not on file    Attends religious service: Not  on file    Active member of club or organization: Not on file    Attends meetings of clubs or organizations: Not on file    Relationship status: Not on file  Other Topics Concern  . Not on file  Social History Narrative   Started smoking related to the tension of her first marriage and 2 children.   Additional Social History:    Allergies:   Allergies  Allergen Reactions  . Sulfonamide Derivatives Anaphylaxis  . Other     Red food Coloring  . Penicillins Other (See Comments)    Unknown reactions from PT    Labs:  Results for orders placed or performed during the hospital encounter of 04/04/18 (from the past 48 hour(s))  Comprehensive metabolic panel     Status: Abnormal   Collection Time: 04/04/18  5:47 PM  Result Value Ref Range   Sodium 143 135 - 145 mmol/L   Potassium 3.7 3.5 - 5.1 mmol/L   Chloride 112 (H) 98 - 111 mmol/L    Comment: Please note change in reference range.   CO2 18 (L) 22 - 32 mmol/L   Glucose, Bld 98 70 -  99 mg/dL    Comment: Please note change in reference range.   BUN 8 6 - 20 mg/dL    Comment: Please note change in reference range.   Creatinine, Ser 0.67 0.44 - 1.00 mg/dL   Calcium 8.8 (L) 8.9 - 10.3 mg/dL   Total Protein 7.5 6.5 - 8.1 g/dL   Albumin 4.1 3.5 - 5.0 g/dL   AST 41 15 - 41 U/L   ALT 45 (H) 0 - 44 U/L    Comment: Please note change in reference range.   Alkaline Phosphatase 75 38 - 126 U/L   Total Bilirubin 0.4 0.3 - 1.2 mg/dL   GFR calc non Af Amer >60 >60 mL/min   GFR calc Af Amer >60 >60 mL/min    Comment: (NOTE) The eGFR has been calculated using the CKD EPI equation. This calculation has not been validated in all clinical situations. eGFR's persistently <60 mL/min signify possible Chronic Kidney Disease.    Anion gap 13 5 - 15    Comment: Performed at [facility], [address] [place], [place] [address]  Ethanol     Status: Abnormal   Collection Time: 04/04/18  5:47 PM  Result Value Ref Range   Alcohol, Ethyl (B) 166 (H) <10 mg/dL    Comment: (NOTE) Lowest detectable limit for serum alcohol is 10 mg/dL. For medical purposes only. Performed at [facility], [address] [place], [place] [address]   Salicylate level     Status: None   Collection Time: 04/04/18  5:47 PM  Result Value Ref Range   Salicylate Lvl <0.1 2.8 - 30.0 mg/dL    Comment: Performed at [facility], [address] [place], [place] [address]  Acetaminophen level     Status: Abnormal   Collection Time: 04/04/18  5:47 PM  Result Value Ref Range   Acetaminophen (Tylenol), Serum <10 (L) 10 - 30 ug/mL    Comment: (NOTE) Therapeutic concentrations vary significantly. A range of 10-30 ug/mL  may be an effective concentration for many patients. However, some  are best treated at concentrations outside of this range. Acetaminophen concentrations >150 ug/mL at 4 hours after ingestion  and >50 ug/mL at 12 hours after ingestion are often associated with   toxic reactions. Performed at [facility], [address] [place], [place] [address]   cbc     Status: None   Collection Time: 04/04/18  5:47 PM  Result Value Ref Range   WBC 4.6 3.6 - 11.0 K/uL   RBC 4.37 3.80 - 5.20 MIL/uL   Hemoglobin 14.9 12.0 - 16.0 g/dL   HCT 42.5 35.0 - 47.0 %   MCV 97.1 80.0 - 100.0 fL   MCH 34.0 26.0 - 34.0 pg   MCHC 35.1 32.0 - 36.0 g/dL   RDW 12.8 11.5 - 14.5 %   Platelets 229 150 - 440 K/uL    Comment: Performed at [facility], [address] [place], [place] [address]  Urine Drug Screen, Qualitative     Status: Abnormal   Collection Time: 04/04/18  5:47 PM  Result Value Ref Range   Tricyclic, Ur Screen NONE DETECTED NONE DETECTED   Amphetamines, Ur Screen NONE DETECTED NONE DETECTED   MDMA (Ecstasy)Ur Screen NONE DETECTED NONE DETECTED   Cocaine Metabolite,Ur [place] NONE DETECTED NONE DETECTED   Opiate, Ur Screen NONE DETECTED NONE DETECTED   Phencyclidine (PCP) Ur S NONE DETECTED NONE DETECTED   Cannabinoid 50 Ng, Ur [place] NONE DETECTED NONE DETECTED   Barbiturates, Ur Screen (A) NONE DETECTED    Result not available. Reagent lot number recalled by manufacturer.   Benzodiazepine, Ur Scrn NONE DETECTED NONE DETECTED   Methadone Scn, Ur NONE DETECTED NONE DETECTED    Comment: (NOTE) Tricyclics + metabolites, urine    Cutoff 1000 ng/mL Amphetamines + metabolites, urine  Cutoff 1000 ng/mL MDMA (Ecstasy), urine              Cutoff 500 ng/mL Cocaine Metabolite, urine          Cutoff 300 ng/mL Opiate + metabolites, urine        Cutoff 300 ng/mL Phencyclidine (PCP), urine         Cutoff 25 ng/mL Cannabinoid, urine                 Cutoff 50 ng/mL Barbiturates + metabolites, urine  Cutoff 200 ng/mL Benzodiazepine, urine              Cutoff 200 ng/mL Methadone, urine                   Cutoff 300 ng/mL The urine drug screen provides only a preliminary, unconfirmed analytical test result and should not be used for non-medical purposes.  Clinical consideration and professional judgment should be applied to any positive drug screen result due to possible interfering substances. A more specific alternate chemical method must be used in order to obtain a confirmed analytical result. Gas chromatography / mass spectrometry (GC/MS) is the preferred confirmat ory method. Performed at [facility], [address] [place], [place] [address]     No current facility-administered medications for this encounter.    Current Outpatient Medications  Medication Sig Dispense Refill  . ALPRAZolam (XANAX) 0.25 MG tablet Take 1 tablet (0.25 mg total) by mouth daily. AS NEEDED for anxiety 30 tablet 1  . buPROPion (WELLBUTRIN XL) 150 MG 24 hr tablet Take 1 tablet (150 mg total) by mouth every morning. 30 tablet 1  . FLUoxetine 60 MG TABS Take 60 mg by mouth daily. 90 tablet 1  . Multiple Vitamin (MULTIVITAMIN WITH MINERALS) TABS tablet Take 1 tablet by mouth daily.    . propranolol (INDERAL) 20 MG tablet Take 1 tablet (20 mg total) by mouth 3 (three) times daily as needed. [place]  tablet 2  . PROVENTIL HFA 108 (90 Base) MCG/ACT inhaler Inhale 2 puffs into the lungs every 4 (four) hours as needed.      Musculoskeletal: Strength & Muscle Tone: within normal limits Gait & Station: normal Patient leans: N/A  Psychiatric Specialty Exam: Physical Exam  Nursing note and vitals reviewed. Constitutional: She appears well-developed and well-nourished.  HENT:  Head: Normocephalic and atraumatic.  Eyes: Pupils are equal, round, and reactive to light. Conjunctivae are normal.  Neck: Normal range of motion.  Cardiovascular: Regular rhythm and normal heart sounds.  Respiratory: Effort normal. No respiratory distress.  GI: Soft.  Musculoskeletal: Normal range of motion.  Neurological: She is alert.  Skin: Skin is warm and dry.  Psychiatric: Judgment normal. Her affect is blunt. Her speech is delayed. She is slowed. Cognition and memory are  normal. She expresses no homicidal and no suicidal ideation.    Review of Systems  Constitutional: Negative.   HENT: Negative.   Eyes: Negative.   Respiratory: Negative.   Cardiovascular: Negative.   Gastrointestinal: Negative.   Musculoskeletal: Negative.   Skin: Negative.   Neurological: Negative.   Psychiatric/Behavioral: Positive for depression, memory loss and substance abuse. Negative for hallucinations and suicidal ideas. The patient is not nervous/anxious.     Blood pressure (!) 141/77, pulse 79, temperature 98.9 F (37.2 C), temperature source Oral, resp. rate 18, height _0  (1.626 m), weight 88.5 kg (195 lb), last menstrual period 03/21/2018, SpO2 100 %.Body mass index is 33.47 kg/m.  General Appearance: Casual  Eye Contact:  Good  Speech:  Clear and Coherent  Volume:  Normal  Mood:  Euthymic  Affect:  Constricted  Thought Process:  Goal Directed  Orientation:  Full (Time, Place, and Person)  Thought Content:  Logical  Suicidal Thoughts:  No  Homicidal Thoughts:  No  Memory:  Immediate;   Fair Recent;   Fair Remote;   Fair  Judgement:  Fair  Insight:  Fair  Psychomotor Activity:  Normal  Concentration:  Concentration: Fair  Recall:  [employer] of Knowledge:  Fair  Language:  Fair  Akathisia:  No  Handed:  Right  AIMS (if indicated):     Assets:  Desire for Improvement Physical Health Resilience  ADL's:  Intact  Cognition:  WNL  Sleep:        Treatment Plan Summary: Plan Patient with alcohol abuse and depression.  Currently lucid and physically stable.  Denies any thoughts of suicide.  Patient is agreeable to outpatient treatment in the community.  Prescription will be written to restart Wellbutrin 150 mg a day.  Patient can be discharged to outpatient care either at [facility] or with primary care doctor or with doctors in [place].  Case reviewed with TTS and emergency room doctor.  Discontinue IVC.  Disposition: No evidence of imminent risk to self or  others at present.   Patient does not meet criteria for psychiatric inpatient admission. Supportive therapy provided about ongoing stressors.  Alethia Berthold, MD 04/05/2018 7:57 PM

## 2018-04-05 NOTE — ED Notes (Signed)

## 2018-10-05 ENCOUNTER — Ambulatory Visit: Payer: Self-pay | Admitting: Nurse Practitioner

## 2018-10-05 ENCOUNTER — Encounter: Payer: Self-pay | Admitting: Nurse Practitioner

## 2018-10-05 VITALS — BP 132/82 | HR 88 | Ht 64.0 in | Wt 185.0 lb

## 2018-10-05 DIAGNOSIS — R002 Palpitations: Secondary | ICD-10-CM

## 2018-10-05 DIAGNOSIS — F329 Major depressive disorder, single episode, unspecified: Secondary | ICD-10-CM

## 2018-10-05 DIAGNOSIS — F32A Depression, unspecified: Secondary | ICD-10-CM

## 2018-10-05 MED ORDER — PROPRANOLOL HCL 20 MG PO TABS: 20.0000 mg | ORAL_TABLET | Freq: Three times a day (TID) | ORAL | 0 refills | Status: DC | PRN

## 2018-10-05 MED ORDER — BUPROPION HCL ER (XL) 150 MG PO TB24: 150.0000 mg | ORAL_TABLET | ORAL | 1 refills | Status: DC

## 2018-10-05 NOTE — Progress Notes (Signed)
Office Visit    Patient Name: [name] Date of Encounter: 10/05/2018  Primary Care Provider:  Patient, No Pcp Per Primary Cardiologist:  Ida Rogue, MD  Chief Complaint    43 year old female with a history of palpitations and atrial tachycardia, depression, anxiety, panic attacks, and alcohol abuse, who presents for follow-up related to palpitations/tachycardia.  Past Medical History    Past Medical History:  Diagnosis Date  . COPD (chronic obstructive pulmonary disease) ([place]) 10/26/2016  . Endometriosis   . Headache   . History of echocardiogram    a. 2012 Echo: EF 60-65%. No rwma.  . History of tobacco abuse   . Interstitial cystitis   . Major depression 10/26/2016  . MRSA (methicillin resistant staph aureus) culture positive 2013  . Ovarian cyst   . Panic attacks   . Panic disorder   . PAT (paroxysmal atrial tachycardia) ([place])   . Pyelonephritis   . PYELONEPHRITIS 06/08/2009   Qualifier: Diagnosis of  By: Sidney Ace    . Sinus bradycardia    a. during pregnancy w/ first son.  . Sinus problem    Right maxillary (frequent)   Past Surgical History:  Procedure Laterality Date  . APPENDECTOMY    . LAPAROSCOPY    . ORIF ANKLE FRACTURE Left 09/29/2015   Procedure: OPEN REDUCTION INTERNAL FIXATION (ORIF) ANKLE FRACTURE;  Surgeon: Hessie Knows, MD;  Location: [facility] ORS;  Service: Orthopedics;  Laterality: Left;    Allergies  Allergies  Allergen Reactions  . Sulfonamide Derivatives Anaphylaxis  . Other     Red food Coloring  . Penicillins Other (See Comments)    Unknown reactions from PT    History of Present Illness    43 year old female with the above past medical history including palpitations, depression, anxiety, panic attacks, and alcohol abuse.  She was last seen in clinic in May 2018.  She was previously noted to have sinus bradycardia in the setting of delivering her first child about 20 years ago.  Later, she was found to have tachycardia  with delivery of her second son.  She reports having previously worn monitors and has been using as needed propranolol for elevated heart rates.  Since her last visit, she has lost her job and has had increased stress, anxiety, and depression with somewhat frequent panic attacks in that setting.  She has run out of Wellbutrin.  She reports that up until a month ago, she was drinking very heavily.  She is now being seen by psychiatry services in [place] but will not see 1 of the psychiatrist until March.  She is hoping to get her Wellbutrin refilled here.  In the setting of increased stress, anxiety, and panic attacks, she does note elevations in heart rate.  Tachycardia is typically preceded by panic attacks though sometimes she has elevations in heart rates without anxiety.  When that occurs, it typically lasts a few minutes and resolve spontaneously.  She tries not to take propranolol if she can avoid it and has not taken it in some time.  She denies chest pain, dyspnea, PND, orthopnea, dizziness, syncope, edema, or early satiety.  Home Medications    Prior to Admission medications   Medication Sig Start Date End Date Taking? Authorizing Provider  buPROPion (WELLBUTRIN XL) 150 MG 24 hr tablet Take 1 tablet (150 mg total) by mouth every morning. 04/05/18 06/04/18  Clapacs, Madie Reno, MD  FLUoxetine 60 MG TABS Take 60 mg by mouth daily. 04/19/17  Eksir, Richard Miu, MD  Multiple Vitamin (MULTIVITAMIN WITH MINERALS) TABS tablet Take 1 tablet by mouth daily.    [provider]  propranolol (INDERAL) 20 MG tablet Take 1 tablet (20 mg total) by mouth 3 (three) times daily as needed. 01/22/17   Minna Merritts, MD  PROVENTIL HFA 108 [phone] Base) MCG/ACT inhaler Inhale 2 puffs into the lungs every 4 (four) hours as needed. 10/01/16   [provider]    Review of Systems    Increased depression, anxiety, panic attacks, and associated tachycardias since losing her job.  She was drinking  heavily but stopped 1 month ago.  She denies chest pain, dyspnea, PND, orthopnea, dizziness, syncope, edema, or early satiety.  All other systems reviewed and are otherwise negative except as noted above.  Physical Exam    VS:  BP 132/82 (BP Location: Left Arm, Patient Position: Sitting, Cuff Size: Normal)   Pulse 88   Ht 5\' 4"  (1.626 m)   Wt 185 lb (83.9 kg)   BMI 31.76 kg/m  , BMI Body mass index is 31.76 kg/m. GEN: Well nourished, well developed, in no acute distress. HEENT: normal. Neck: Supple, no JVD, carotid bruits, or masses. Cardiac: RRR, no murmurs, rubs, or gallops. No clubbing, cyanosis, edema.  Radials/PT 2+ and equal bilaterally.  Respiratory:  Respirations regular and unlabored, clear to auscultation bilaterally. GI: Soft, nontender, nondistended, BS + x 4. MS: no deformity or atrophy. Skin: warm and dry, no rash. Neuro:  Strength and sensation are intact. Psych: Normal affect.  Accessory Clinical Findings    ECG personally reviewed by me today -regular sinus rhythm, 88- no acute changes.  Assessment & Plan    1.  Palpitations: Patient with a history of elevated heart rates in the setting of anxiety and panic attacks.  She apparently also has a history of paroxysmal atrial tachycardia previous documented on monitoring that those reports are not available to me.  She has PRN propranolol therapy though this is expired and I will refill.  She has not been using propranolol and recognizes that much of her tachycardia is driven by emotional upset and panic.  I am going to refill her Wellbutrin as treatment of anxiety depression may reduce her burden of tachycardia.  She will follow-up with psychiatry in [place] in March at which point they can refill all future prescriptions for antidepressant therapy.  She sometimes has elevations of heart rate in the absence of anxiety.  This is generally short-lived.  I advised her that that would be the time to take propranolol.  We  could consider repeat monitoring though she does not currently have insurance.  2.  Depression/anxiety/panic attacks: As above, being seen by psychiatry in [facility].  She will see a physician there in March.  Given her short-term refill on her Wellbutrin until she can see them.  3.  Alcohol abuse: She quit a month ago.  Complete cessation advised that this could contribute to atrial tachycardias.  4.  Disposition: Follow-up with Dr. Rockey Situ in 6 months or sooner if necessary.  Murray Hodgkins, NP 10/05/2018, 8:46 AM

## 2018-10-05 NOTE — Patient Instructions (Signed)
Medication Instructions:  No changes If you need a refill on your cardiac medications before your next appointment, please call your pharmacy.   Lab work: None ordered If you have labs (blood work) drawn today and your tests are completely normal, you will receive your results only by: Marland Kitchen MyChart Message (if you have MyChart) OR . A paper copy in the mail If you have any lab test that is abnormal or we need to change your treatment, we will call you to review the results.  Testing/Procedures: None ordered  Follow-Up: At [facility], you and your health needs are our priority.  As part of our continuing mission to provide you with exceptional heart care, we have created designated Provider Care Teams.  These Care Teams include your primary Cardiologist (physician) and Advanced Practice Providers (APPs -  Physician Assistants and Nurse Practitioners) who all work together to provide you with the care you need, when you need it. You will need a follow up appointment in 6 months.  Please call our office 2 months in advance to schedule this appointment.  You may see Ida Rogue, MD or one of the following Advanced Practice Providers on your designated Care Team:   Murray Hodgkins, NP Christell Faith, PA-C . Marrianne Mood, PA-C

## 2018-10-07 ENCOUNTER — Encounter: Payer: Self-pay | Admitting: Emergency Medicine

## 2018-10-07 ENCOUNTER — Emergency Department
Admission: EM | Admit: 2018-10-07 | Discharge: 2018-10-08 | Disposition: A | Discharge status: Home / Self Care | Payer: Self-pay | Attending: Emergency Medicine | Admitting: Emergency Medicine

## 2018-10-07 DIAGNOSIS — Z79899 Other long term (current) drug therapy: Secondary | ICD-10-CM | POA: Insufficient documentation

## 2018-10-07 DIAGNOSIS — J449 Chronic obstructive pulmonary disease, unspecified: Secondary | ICD-10-CM | POA: Insufficient documentation

## 2018-10-07 DIAGNOSIS — F17210 Nicotine dependence, cigarettes, uncomplicated: Secondary | ICD-10-CM | POA: Insufficient documentation

## 2018-10-07 DIAGNOSIS — F10929 Alcohol use, unspecified with intoxication, unspecified: Secondary | ICD-10-CM

## 2018-10-07 DIAGNOSIS — F10120 Alcohol abuse with intoxication, uncomplicated: Secondary | ICD-10-CM | POA: Insufficient documentation

## 2018-10-07 DIAGNOSIS — F329 Major depressive disorder, single episode, unspecified: Secondary | ICD-10-CM | POA: Insufficient documentation

## 2018-10-07 LAB — COMPREHENSIVE METABOLIC PANEL
ALBUMIN: 4.7 g/dL (ref 3.5–5.0)
ALK PHOS: 77 U/L (ref 38–126)
ALT: 44 U/L (ref 0–44)
ANION GAP: 12 (ref 5–15)
AST: 36 U/L (ref 15–41)
BILIRUBIN TOTAL: 0.5 mg/dL (ref 0.3–1.2)
BUN: 9 mg/dL (ref 6–20)
CALCIUM: 9.1 mg/dL (ref 8.9–10.3)
CO2: 15 mmol/L — ABNORMAL LOW (ref 22–32)
Chloride: 112 mmol/L — ABNORMAL HIGH (ref 98–111)
Creatinine, Ser: 0.71 mg/dL (ref 0.44–1.00)
GFR calc non Af Amer: >60 mL/min (ref >60)
Glucose, Bld: 97 mg/dL (ref 70–99)
POTASSIUM: 4 mmol/L (ref 3.5–5.1)
Sodium: 139 mmol/L (ref 135–145)
TOTAL PROTEIN: 8.1 g/dL (ref 6.5–8.1)

## 2018-10-07 LAB — CBC
HCT: 43.3 % (ref 36.0–46.0)
Hemoglobin: 15 g/dL (ref 12.0–15.0)
MCH: 33.2 pg (ref 26.0–34.0)
MCHC: 34.6 g/dL (ref 30.0–36.0)
MCV: 95.8 fL (ref 80.0–100.0)
NRBC: 0 % (ref 0.0–0.2)
Platelets: 270 10*3/uL (ref 150–400)
RBC: 4.52 MIL/uL (ref 3.87–5.11)
RDW: 12.3 % (ref 11.5–15.5)
WBC: 4.9 10*3/uL (ref 4.0–10.5)

## 2018-10-07 LAB — URINE DRUG SCREEN, QUALITATIVE (ARMC ONLY)
AMPHETAMINES, UR SCREEN: NOT DETECTED
BARBITURATES, UR SCREEN: NOT DETECTED
Benzodiazepine, Ur Scrn: NOT DETECTED
CANNABINOID 50 NG, UR ~~LOC~~: NOT DETECTED
Cocaine Metabolite,Ur ~~LOC~~: NOT DETECTED
MDMA (ECSTASY) UR SCREEN: NOT DETECTED
Methadone Scn, Ur: NOT DETECTED
Opiate, Ur Screen: NOT DETECTED
PHENCYCLIDINE (PCP) UR S: NOT DETECTED
Tricyclic, Ur Screen: NOT DETECTED

## 2018-10-07 LAB — ETHANOL: Alcohol, Ethyl (B): 315 mg/dL (ref <10)

## 2018-10-07 MED ORDER — LORAZEPAM 2 MG/ML IJ SOLN
INTRAMUSCULAR | Status: AC
  Administered 2018-10-07: 16:00:00
  Filled 2018-10-07: qty 1

## 2018-10-07 MED ORDER — HALOPERIDOL LACTATE 5 MG/ML IJ SOLN
INTRAMUSCULAR | Status: AC
  Administered 2018-10-07: 5 mg
  Filled 2018-10-07: qty 1

## 2018-10-07 MED ORDER — HALOPERIDOL LACTATE 5 MG/ML IJ SOLN
INTRAMUSCULAR | Status: AC
  Administered 2018-10-07: 16:00:00
  Filled 2018-10-07: qty 1

## 2018-10-07 NOTE — ED Triage Notes (Signed)
Pt in via [place] [employer] under IVC. Commitment papers reports pt is SI. Pt loud and cursing officers. Pt denies SI. Pt continues to curse law enformcement.

## 2018-10-07 NOTE — ED Notes (Addendum)
Pt dressed out by this [employer], and [employer].  Pt belongings placed in hospital belongings bag. Grey sweat shirt Red shirt Black sweat pants grey underwear Black shoes White socks  Pt's driver's license placed in sample collection bag and placed in pt's bag with other belongings.

## 2018-10-07 NOTE — ED Provider Notes (Signed)
[facility] Emergency Department Provider Note   ____________________________________________   First MD Initiated Contact with Patient 10/07/18 1545     (approximate)  I have reviewed the triage vital signs and the nursing notes.   HISTORY  Chief Complaint Mental Health Problem    HPI [name] is a 43 y.o. female comes in under commitment reportedly having threatened her family.  She also was reportedly acting intoxicated walking into doors.  She was not recognizing her family.  Here in the emergency room she is yelling and screaming will not stay in her room she will not be quiet she will not listen to reason.  Threatening the police officers.   Past Medical History:  Diagnosis Date  . COPD (chronic obstructive pulmonary disease) ([place]) 10/26/2016  . Endometriosis   . Headache   . History of echocardiogram    a. 2012 Echo: EF 60-65%. No rwma.  . History of tobacco abuse   . Interstitial cystitis   . Major depression 10/26/2016  . MRSA (methicillin resistant staph aureus) culture positive 2013  . Ovarian cyst   . Panic attacks   . Panic disorder   . PAT (paroxysmal atrial tachycardia) ([place])   . Pyelonephritis   . PYELONEPHRITIS 06/08/2009   Qualifier: Diagnosis of  By: Sidney Ace    . Sinus bradycardia    a. during pregnancy w/ first son.  . Sinus problem    Right maxillary (frequent)    Patient Active Problem List   Diagnosis Date Noted  . Substance induced mood disorder ([place]) 04/05/2018  . Panic attacks 01/22/2017  . Major depressive disorder, recurrent severe without psychotic features ([place]) 10/26/2016  . Alcohol use disorder, moderate, dependence ([place]) 10/26/2016  . COPD (chronic obstructive pulmonary disease) ([place]) 10/26/2016  . Vaginal dryness 03/14/2016  . Trimalleolar fracture of ankle, closed 09/28/2015  . Atrial tachycardia ([place]) 08/09/2015  . Atrial tachycardia, paroxysmal ([place]) 01/05/2014  . Obesity (BMI 30-39.9)  12/19/2013  . Migraine without aura 12/29/2011  . Anxiety 12/29/2011  . Insomnia 12/29/2011  . Tobacco use disorder 10/08/2010  . BRADYCARDIA 06/12/2009  . CAROTID BRUIT 06/12/2009  . INTERSTITIAL CYSTITIS 06/08/2009  . ENDOMETRIOSIS 06/08/2009  . PANIC DISORDER, HX OF 06/08/2009  . TOBACCO ABUSE, HX OF 06/08/2009    Past Surgical History:  Procedure Laterality Date  . APPENDECTOMY    . LAPAROSCOPY    . ORIF ANKLE FRACTURE Left 09/29/2015   Procedure: OPEN REDUCTION INTERNAL FIXATION (ORIF) ANKLE FRACTURE;  Surgeon: Hessie Knows, MD;  Location: [facility] ORS;  Service: Orthopedics;  Laterality: Left;    Prior to Admission medications   Medication Sig Start Date End Date Taking? Authorizing Provider  buPROPion (WELLBUTRIN XL) 150 MG 24 hr tablet Take 1 tablet (150 mg total) by mouth every morning. 10/05/18 12/04/18  Theora Gianotti, NP  FLUoxetine 60 MG TABS Take 60 mg by mouth daily. Patient not taking: Reported on 10/05/2018 04/19/17   Aundra Dubin, MD  Multiple Vitamin (MULTIVITAMIN WITH MINERALS) TABS tablet Take 1 tablet by mouth daily.    [provider]  propranolol (INDERAL) 20 MG tablet Take 1 tablet (20 mg total) by mouth 3 (three) times daily as needed. 10/05/18   Theora Gianotti, NP  PROVENTIL HFA 108 [phone] Base) MCG/ACT inhaler Inhale 2 puffs into the lungs every 4 (four) hours as needed. 10/01/16   [provider]    Allergies Sulfonamide derivatives; Other; and Penicillins  Family History  Problem Relation Age  of Onset  . Leukemia Paternal Grandfather   . Thyroid disease Maternal Aunt   . Cancer Neg Hx        No obvious cancer  . Breast cancer Neg Hx     Social History Social History   Tobacco Use  . Smoking status: Current Every Day Smoker    Packs/day: 0.50    Years: 11.00    Pack years: 5.50    Types: Cigarettes  . Smokeless tobacco: Never Used  Substance Use Topics  . Alcohol use: Yes    Alcohol/week: 4.0  standard drinks    Types: 4 Glasses of wine per week  . Drug use: No    Review of Systems  She refuses to answer ____________________________________________   PHYSICAL EXAM:  VITAL SIGNS: ED Triage Vitals [10/07/18 1443]  Enc Vitals Group     BP      Pulse      Resp      Temp      Temp src      SpO2      Weight 160 lb (72.6 kg)     Height 5\' 4"  (1.626 m)     Head Circumference      Peak Flow      Pain Score 0     Pain Loc      Pain Edu?      Excl. in [place]?     Constitutional: Alert and oriented. Well appearing and in no acute distress. Eyes: Conjunctivae are normal. PER. EOMI. Head: Atraumatic. Nose: No congestion/rhinnorhea. Mouth/Throat: Mucous membranes are moist.  Oropharynx non-erythematous. Neck: No stridor. Cardiovascular: Normal rate, regular rhythm. Grossly normal heart sounds.  Good peripheral circulation. Respiratory: Normal respiratory effort.  No retractions. Lungs CTAB. Gastrointestinal: Soft and nontender. No distention. No abdominal bruits. No CVA tenderness. Musculoskeletal: No lower extremity tenderness nor edema.  No joint effusions. Neurologic:  Normal speech and language although very agitated and angry. No gross focal neurologic deficits are appreciated. No gait instability at present. Skin:  Skin is warm, dry and intact. No rash noted.   ____________________________________________   LABS (all labs ordered are listed, but only abnormal results are displayed)  Labs Reviewed  COMPREHENSIVE METABOLIC PANEL - Abnormal; Notable for the following components:      Result Value   Chloride 112 (*)    CO2 15 (*)    All other components within normal limits  ETHANOL - Abnormal; Notable for the following components:   Alcohol, Ethyl (B) 315 (*)    All other components within normal limits  CBC  URINE DRUG SCREEN, QUALITATIVE ([facility] ONLY)  POC URINE PREG, ED    ____________________________________________  EKG   ____________________________________________  RADIOLOGY  ED MD interpretation:    Official radiology report(s): No results found.  ____________________________________________   PROCEDURES  Procedure(s) performed:   Procedures  Critical Care performed: __________________________________   INITIAL IMPRESSION / ASSESSMENT AND PLAN / ED COURSE   Initially comes down goes back in room but then comes out again and starts to scream and yell she will come back in room.  She was given Haldol and Ativan IM.  Alcohol level came back 315.      ____________________________________________   FINAL CLINICAL IMPRESSION(S) / ED DIAGNOSES  Final diagnoses:  Alcoholic intoxication with complication [facility])     ED Discharge Orders    None       Note:  This document was prepared using Dragon voice recognition software and may include unintentional dictation  errors.    Nena Polio, MD 10/07/18 [phone]

## 2018-10-07 NOTE — ED Notes (Signed)
Pt. Alert laying in bed, calm and cooperative at this time.

## 2018-10-07 NOTE — ED Notes (Signed)
Pt snoring at this time 

## 2018-10-08 MED ORDER — SODIUM CHLORIDE 0.9 % IV BOLUS
1000.0000 mL | Freq: Once | INTRAVENOUS | Status: AC
  Administered 2018-10-08: 1000 mL via INTRAVENOUS

## 2018-10-08 NOTE — ED Provider Notes (Signed)
-----------------------------------------   6:36 AM on 10/08/2018 -----------------------------------------   Blood pressure 109/70, pulse (!) 144, temperature 98 F (36.7 C), temperature source Oral, resp. rate 18, height 5\' 4"  (1.626 m), weight 72.6 kg, last menstrual period 10/04/2018, SpO2 100 %.  The patient is sleeping at this time.  There have been no acute events since the last update.  Awaiting disposition plan from Behavioral Medicine team.    Paulette Blanch, MD 10/08/18 [phone]

## 2018-10-08 NOTE — ED Notes (Signed)
Pt discharged to lobby. VS stable. Discharge paperwork reviewed with patient. Patient signed hard copy. Pt denies SI/HI. Denies pain. All belongings returned to patient.
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
## 2018-10-08 NOTE — BH Assessment (Signed)
SOC recommends patient discharge.

## 2018-10-08 NOTE — ED Notes (Signed)
SOC complete.  Patient resting quietly in room. No noted distress or abnormal behaviors noted. Will continue 15 minute checks and observation by security for safety.

## 2018-10-08 NOTE — ED Provider Notes (Addendum)
-----------------------------------------   12:44 PM on 10/08/2018 -----------------------------------------   Blood pressure (!) 143/82, pulse (!) 125, temperature 98 F (36.7 C), temperature source Oral, resp. rate 18, height 5\' 4"  (1.626 m), weight 72.6 kg, last menstrual period 10/04/2018, SpO2 98 %.  The patient is calm and cooperative at this time.  There have been no acute events since the last update.  Patient clinically sober at this time.  No suicidal or homicidal ideation.  Was evaluated by the psychiatrist, Dr. Adron Bene, who has reversed the patient's involuntary commitment and deemed the patient appropriate for follow-up as an outpatient.  Patient has follow-up at the [facility] this coming Monday with her therapist.  Patient is understanding of the diagnosis well treatment and willing to comply.   Orbie Pyo, MD 10/08/18 [place], Randall An, MD 10/08/18 1245  Pulse retaken in 110.  Patient is having a steadily decreased pulse without any complaints at this time.  Likely from dehydration and intoxication.  Do not feel that it represents acute emergent pathology such as PE or sepsis or another condition that would require further treatment.   Orbie Pyo, MD 10/08/18 1253

## 2018-10-08 NOTE — ED Notes (Signed)
Pt. Up and using bathroom, pt. Returned to bed with steady gait.

## 2020-11-01 ENCOUNTER — Telehealth: Payer: Self-pay | Admitting: Cardiovascular Disease

## 2020-11-01 NOTE — Telephone Encounter (Signed)
3 attempts to schedule fu appt from recall list.   Deleting recall.   

## 2020-11-14 ENCOUNTER — Other Ambulatory Visit: Payer: Self-pay | Admitting: Family Medicine

## 2020-11-14 DIAGNOSIS — Z1231 Encounter for screening mammogram for malignant neoplasm of breast: Secondary | ICD-10-CM

## 2021-01-08 ENCOUNTER — Ambulatory Visit: Payer: Self-pay | Admitting: Nurse Practitioner

## 2021-01-08 NOTE — Progress Notes (Deleted)
Office Visit    Patient Name: [name] Date of Encounter: 01/08/2021  Primary Care Provider:  Patient, No Pcp Per (Inactive) Primary Cardiologist:  Ida Rogue, MD  Chief Complaint    45 year old female with a history of palpitations, atrial tachycardia, depression, anxiety, panic attacks, and alcohol abuse, who presents for follow-up related to palpitations and tachycardia.  Past Medical History    Past Medical History:  Diagnosis Date  . COPD (chronic obstructive pulmonary disease) ([place]) 10/26/2016  . Endometriosis   . Headache   . History of echocardiogram    a. 2012 Echo: EF 60-65%. No rwma.  . History of tobacco abuse   . Interstitial cystitis   . Major depression 10/26/2016  . MRSA (methicillin resistant staph aureus) culture positive 2013  . Ovarian cyst   . Panic attacks   . Panic disorder   . PAT (paroxysmal atrial tachycardia) ([place])   . Pyelonephritis   . PYELONEPHRITIS 06/08/2009   Qualifier: Diagnosis of  By: Sidney Ace    . Sinus bradycardia    a. during pregnancy w/ first son.  . Sinus problem    Right maxillary (frequent)   Past Surgical History:  Procedure Laterality Date  . APPENDECTOMY    . LAPAROSCOPY    . ORIF ANKLE FRACTURE Left 09/29/2015   Procedure: OPEN REDUCTION INTERNAL FIXATION (ORIF) ANKLE FRACTURE;  Surgeon: Hessie Knows, MD;  Location: [facility] ORS;  Service: Orthopedics;  Laterality: Left;    Allergies  Allergies  Allergen Reactions  . Sulfonamide Derivatives Anaphylaxis  . Other     Red food Coloring  . Penicillins Other (See Comments)    Unknown reactions from PT    History of Present Illness    45 year old female with above past medical history including palpitations, depression, anxiety, panic attacks, and alcohol abuse.  She was last seen in clinic in January 2020.  She was previously noted to have sinus bradycardia in the setting of delivering her first child more than 20 years ago.  Later, she was found to have  tachycardia with delivery of her second son.  She previously wore event monitors and uses as needed propranolol for elevated heart rates.  At her last office visit in January 2020, she reported intermittent tachycardia, in the setting of increased stress and anxiety after losing her job and drinking heavily.  She had also come off of her antidepressants at that time.  Tachycardic episodes are typically preceded by panic attacks, lasted a few minutes, resolved spontaneously.  Since her last visit,  Home Medications    Prior to Admission medications   Medication Sig Start Date End Date Taking? Authorizing Provider  buPROPion (WELLBUTRIN XL) 150 MG 24 hr tablet Take 1 tablet (150 mg total) by mouth every morning. 10/05/18 12/04/18  Theora Gianotti, NP  propranolol (INDERAL) 20 MG tablet Take 1 tablet (20 mg total) by mouth 3 (three) times daily as needed. 10/05/18   Theora Gianotti, NP    Review of Systems    ***.  All other systems reviewed and are otherwise negative except as noted above.  Physical Exam    VS:  There were no vitals taken for this visit. , BMI There is no height or weight on file to calculate BMI. GEN: Well nourished, well developed, in no acute distress. HEENT: normal. Neck: Supple, no JVD, carotid bruits, or masses. Cardiac: RRR, no murmurs, rubs, or gallops. No clubbing, cyanosis, edema.  Radials/DP/PT 2+ and equal bilaterally.  Respiratory:  Respirations regular and unlabored, clear to auscultation bilaterally. GI: Soft, nontender, nondistended, BS + x 4. MS: no deformity or atrophy. Skin: warm and dry, no rash. Neuro:  Strength and sensation are intact. Psych: Normal affect.  Accessory Clinical Findings    ECG personally reviewed by me today - *** - no acute changes.  Lab Results  Component Value Date   WBC 4.9 10/07/2018   HGB 15.0 10/07/2018   HCT 43.3 10/07/2018   MCV 95.8 10/07/2018   PLT 270 10/07/2018   Lab Results  Component Value  Date   CREATININE 0.71 10/07/2018   BUN 9 10/07/2018   NA 139 10/07/2018   K 4.0 10/07/2018   CL 112 (H) 10/07/2018   CO2 15 (L) 10/07/2018   Lab Results  Component Value Date   ALT 44 10/07/2018   AST 36 10/07/2018   ALKPHOS 77 10/07/2018   BILITOT 0.5 10/07/2018   Lab Results  Component Value Date   CHOL 150 12/03/2011   HDL 59 12/03/2011   LDLCALC 82 12/03/2011   TRIG 44 12/03/2011   CHOLHDL 2.5 12/03/2011    No results found for: HGBA1C  Assessment & Plan    1.  ***   Murray Hodgkins, NP 01/08/2021, 8:38 AM

## 2021-01-14 NOTE — Progress Notes (Signed)
Cardiology Office Note:    Date:  01/15/2021   ID:  [name], DOB 09/22/1975, MRN [id number]  PCP:  Ellene Route  [facility] Cardiologist:  Ida Rogue, MD  [place] Electrophysiologist:  None   Referring MD: No ref. provider found   Chief Complaint: overdue month follow-up; palpitations and elevated HR  History of Present Illness:    [name] is a 45 y.o. female with a hx of palpitations, atrial tachycardia, depression, anxiety, panic attacks, alcohol abuse who presents for follow-up.  H/o bradcyardia during the delivery of her first son and tachycardia in the setting of delivery of her second son. Used propranolol in the past for heart rates. Follows with [facility] for psych issues.   Last seen 10/05/2018 and had increased palpitations in the setting of anxiety. BB and Wellbutrin were refilled. Reported alcohol cessation one month prior.   Today, the patient is concerned her heart rate is high. Can be up to 140 after she eats. At rest it can be at 120. Wants to start exercising but is unsure what her heart rate should be. It does go down into 80-90s. She does have palpitations, they feel different than before. Feels heavy. No chest pain or SOB. No LLE, orthopnea, pnd. No fever, chills, nausea, vomiting. Had labs a couple months ago at PCP, reviewed and unremarkable. No TSH performed. Has not been taking propranolol. Was only taking it twice a year.   No other significant changes in medical history. Patient is doing much better from a psych , not on any psych meds and not seeing [facility] regularly.   Past Medical History:  Diagnosis Date  . COPD (chronic obstructive pulmonary disease) ([place]) 10/26/2016  . Endometriosis   . Headache   . History of echocardiogram    a. 2012 Echo: EF 60-65%. No rwma.  . History of tobacco abuse   . Interstitial cystitis   . Major depression 10/26/2016  . MRSA (methicillin resistant staph aureus) culture positive 2013  .  Ovarian cyst   . Panic attacks   . Panic disorder   . PAT (paroxysmal atrial tachycardia) ([place])   . Pyelonephritis   . PYELONEPHRITIS 06/08/2009   Qualifier: Diagnosis of  By: [place] Ace    . Sinus bradycardia    a. during pregnancy w/ first son.  . Sinus problem    Right maxillary (frequent)    Past Surgical History:  Procedure Laterality Date  . APPENDECTOMY    . LAPAROSCOPY    . ORIF ANKLE FRACTURE Left 09/29/2015   Procedure: OPEN REDUCTION INTERNAL FIXATION (ORIF) ANKLE FRACTURE;  Surgeon: Hessie Knows, MD;  Location: [facility] ORS;  Service: Orthopedics;  Laterality: Left;    Current Medications: Current Meds  Medication Sig  . propranolol (INDERAL) 20 MG tablet Take 1 tablet (20 mg total) by mouth 3 (three) times daily as needed.     Allergies:   Sulfonamide derivatives, Other, and Penicillins   Social History   Socioeconomic History  . Marital status: Divorced    Spouse name: Not on file  . Number of children: 2  . Years of education: Not on file  . Highest education level: Not on file  Occupational History  . Occupation: Set designer: [employer]  Tobacco Use  . Smoking status: Current Every Day Smoker    Packs/day: 0.50    Years: 11.00    Pack years: 5.50    Types: Cigarettes  . Smokeless tobacco: Never Used  Vaping Use  . Vaping Use: Never used  Substance and Sexual Activity  . Alcohol use: Yes    Alcohol/week: 4.0 standard drinks    Types: 4 Glasses of wine per week  . Drug use: No  . Sexual activity: Yes    Partners: Male    Birth control/protection: Surgical, Injection    Comment: vasectomy  Other Topics Concern  . Not on file  Social History Narrative   Started smoking related to the tension of her first marriage and 2 children.   Social Determinants of Health   Financial Resource Strain: Not on file  Food Insecurity: Not on file  Transportation Needs: Not on file  Physical Activity: Not on file  Stress: Not  on file  Social Connections: Not on file     Family History: The patient's *family history includes Leukemia in her paternal grandfather; Thyroid disease in her maternal aunt. There is no history of Cancer or Breast cancer.  ROS:   Please see the history of present illness.     All other systems reviewed and are negative.  EKGs/Labs/Other Studies Reviewed:    The following studies were reviewed today:  Echo 2012 Study Conclusions  Left ventricle: The cavity size was normal. Wall thickness was  normal. Systolic function was normal. The estimated ejection  fraction was in the range of 60% to 65%. Wall motion was normal;  there were no regional wall motion abnormalities. Left ventricular  diastolic function parameters were normal.    EKG:  EKG is ordered today.  The ekg ordered today demonstrates NSR, 82bpm, no ST/T wave changes  Recent Labs: No results found for requested labs within last 8760 hours.  Recent Lipid Panel    Component Value Date/Time   CHOL 150 12/03/2011 1555   TRIG 44 12/03/2011 1555   HDL 59 12/03/2011 1555   CHOLHDL 2.5 12/03/2011 1555   VLDL 9 12/03/2011 1555   LDLCALC 82 12/03/2011 1555    Physical Exam:    VS:  BP 110/76 (BP Location: Left Arm, Patient Position: Sitting, Cuff Size: Normal)   Pulse 82   Ht 5\' 4"  (1.626 m)   Wt 182 lb (82.6 kg)   SpO2 98%   BMI 31.24 kg/m     Wt Readings from Last 3 Encounters:  01/15/21 182 lb (82.6 kg)  10/07/18 160 lb (72.6 kg)  10/05/18 185 lb (83.9 kg)     GEN:  Well nourished, well developed in no acute distress HEENT: Normal NECK: No JVD; No carotid bruits LYMPHATICS: No lymphadenopathy CARDIAC: RRR, no murmurs, rubs, gallops RESPIRATORY:  Clear to auscultation without rales, wheezing or rhonchi  ABDOMEN: Soft, non-tender, non-distended MUSCULOSKELETAL:  No edema; No deformity  SKIN: Warm and dry NEUROLOGIC:  Alert and oriented x 3 PSYCHIATRIC:  Normal affect   ASSESSMENT:    1.  Palpitations    PLAN:    In order of problems listed above:  Palpitations and elevated heart rate Patient reports palpitations and elevated heart rate. Rate can be 120-140 at rest. She has a h/o atrial tachycardia. She reports heart monitors in the past, but unable to find the reports. Patient denies chest pain,sob, lightheadedness or dizziness. Heart rate at baseline 80-90s. EKG today showed SR with heart rate in the 80s. Patient had labs in 10/2020 that were unremarkable. Needs TSH (but wants in network PCP to draw). We will order a 2 week heart monitor. She does not want to take a daily pill if its not necessary,  so we will reassess low dose BB this at follow-up. Will not refill propranolol since she only used it twice a year in the past. Needs TSH by PCP.    PTSD Alcohol abuse This is resolved. No longer following with [facility] or on medications.   Disposition: Follow up in 4-6 weeks with APP/MD    Signed, Chloeann Alfred Ninfa Meeker, PA-C  01/15/2021 3:41 PM    [facility]

## 2021-01-15 ENCOUNTER — Ambulatory Visit: Payer: BLUE CROSS/BLUE SHIELD

## 2021-01-15 ENCOUNTER — Ambulatory Visit (INDEPENDENT_AMBULATORY_CARE_PROVIDER_SITE_OTHER): Payer: Self-pay | Admitting: Medical

## 2021-01-15 ENCOUNTER — Ambulatory Visit (INDEPENDENT_AMBULATORY_CARE_PROVIDER_SITE_OTHER): Payer: BLUE CROSS/BLUE SHIELD

## 2021-01-15 ENCOUNTER — Other Ambulatory Visit: Payer: Self-pay

## 2021-01-15 ENCOUNTER — Encounter: Payer: Self-pay | Admitting: Medical

## 2021-01-15 VITALS — BP 110/76 | HR 82 | Ht 64.0 in | Wt 182.0 lb

## 2021-01-15 DIAGNOSIS — R002 Palpitations: Secondary | ICD-10-CM

## 2021-01-15 NOTE — Patient Instructions (Addendum)
For any questions about your potential cost of ZIO heart monitor, please call [phone] to speak with an i[employer] patient advocate.   Medication Instructions:  No changes  *If you need a refill on your cardiac medications before your next appointment, please call your pharmacy*   Lab Work: TSH lab needs to be done by your Primary care provider.   If you have labs (blood work) drawn today and your tests are completely normal, you will receive your results only by: Marland Kitchen MyChart Message (if you have MyChart) OR . A paper copy in the mail If you have any lab test that is abnormal or we need to change your treatment, we will call you to review the results.   Testing/Procedures: Your physician has recommended that you wear a Zio monitor for 14 days.   This monitor is a medical device that records the heart's electrical activity. Doctors most often use these monitors to diagnose arrhythmias. Arrhythmias are problems with the speed or rhythm of the heartbeat. The monitor is a small device applied to your chest. You can wear one while you do your normal daily activities. While wearing this monitor if you have any symptoms to push the button and record what you felt. Once you have worn this monitor for the period of time provider prescribed (Usually 14 days), you will return the monitor device in the postage paid box. Once it is returned they will download the data collected and provide [place] with a report which the provider will then review and we will call you with those results. Important tips:  1. Avoid showering during the first 24 hours of wearing the monitor. 2. Avoid excessive sweating to help maximize wear time. 3. Do not submerge the device, no hot tubs, and no swimming pools. 4. Keep any lotions or oils away from the patch. 5. After 24 hours you may shower with the patch on. Take brief showers with your back facing the shower head.  6. Do not remove patch once it has been placed because  that will interrupt data and decrease adhesive wear time. 7. Push the button when you have any symptoms and write down what you were feeling. 8. Once you have completed wearing your monitor, remove and place into box which has postage paid and place in your outgoing mailbox.  9. If for some reason you have misplaced your box then call our office and we can provide another box and/or mail it off for you.   Follow-Up: At [facility], you and your health needs are our priority.  As part of our continuing mission to provide you with exceptional heart care, we have created designated Provider Care Teams.  These Care Teams include your primary Cardiologist (physician) and Advanced Practice Providers (APPs -  Physician Assistants and Nurse Practitioners) who all work together to provide you with the care you need, when you need it.   Your next appointment:   4 week(s)  The format for your next appointment:   In Person  Provider:   You may see Ida Rogue, MD or one of the following Advanced Practice Providers on your designated Care Team:    Murray Hodgkins, NP  Christell Faith, PA-C  Marrianne Mood, PA-C  Cadence [place], [place]  Laurann Montana, NP

## 2021-02-11 NOTE — Progress Notes (Deleted)
Cardiology Office Note:    Date:  02/11/2021   ID:  [name], DOB 1976/03/24, MRN [id number]  PCP:  Ellene Route  [facility] Cardiologist:  Ida Rogue, MD  [facility] Electrophysiologist:  None   Referring MD: Ellene Route   Chief Complaint: Zio monitor follow-up  History of Present Illness:    [name] is a 45 y.o. female with a hx of palpiutations, atrial tachycardia, depression, anxiety/panic attacks, alcohol abuse who presents for follow-up.   H/o bradycardia during the delivery of her first son and tachycardia with the delivery of her second son. Used propranolol in the past for heart rates. Since then stopped drinking alcohol.   Seen 01/15/21 for tachycardia. Heart monitor and labs were ordered.   Today,   Palpitations and tachycardia    Past Medical History:  Diagnosis Date  . COPD (chronic obstructive pulmonary disease) ([place]) 10/26/2016  . Endometriosis   . Headache   . History of echocardiogram    a. 2012 Echo: EF 60-65%. No rwma.  . History of tobacco abuse   . Interstitial cystitis   . Major depression 10/26/2016  . MRSA (methicillin resistant staph aureus) culture positive 2013  . Ovarian cyst   . Panic attacks   . Panic disorder   . PAT (paroxysmal atrial tachycardia) ([place])   . Pyelonephritis   . PYELONEPHRITIS 06/08/2009   Qualifier: Diagnosis of  By: [place] Ace    . Sinus bradycardia    a. during pregnancy w/ first son.  . Sinus problem    Right maxillary (frequent)    Past Surgical History:  Procedure Laterality Date  . APPENDECTOMY    . LAPAROSCOPY    . ORIF ANKLE FRACTURE Left 09/29/2015   Procedure: OPEN REDUCTION INTERNAL FIXATION (ORIF) ANKLE FRACTURE;  Surgeon: Hessie Knows, MD;  Location: [facility] ORS;  Service: Orthopedics;  Laterality: Left;    Current Medications: No outpatient medications have been marked as taking for the 02/12/21 encounter (Appointment) with Kathlen Mody, Chasitie Passey H, PA-C.      Allergies:   Sulfonamide derivatives, Other, and Penicillins   Social History   Socioeconomic History  . Marital status: Divorced    Spouse name: Not on file  . Number of children: 2  . Years of education: Not on file  . Highest education level: Not on file  Occupational History  . Occupation: Set designer: [employer]  Tobacco Use  . Smoking status: Current Every Day Smoker    Packs/day: 0.50    Years: 11.00    Pack years: 5.50    Types: Cigarettes  . Smokeless tobacco: Never Used  Vaping Use  . Vaping Use: Never used  Substance and Sexual Activity  . Alcohol use: Yes    Alcohol/week: 4.0 standard drinks    Types: 4 Glasses of wine per week  . Drug use: No  . Sexual activity: Yes    Partners: Male    Birth control/protection: Surgical, Injection    Comment: vasectomy  Other Topics Concern  . Not on file  Social History Narrative   Started smoking related to the tension of her first marriage and 2 children.   Social Determinants of Health   Financial Resource Strain: Not on file  Food Insecurity: Not on file  Transportation Needs: Not on file  Physical Activity: Not on file  Stress: Not on file  Social Connections: Not on file     Family History: The patient's ***family history includes Leukemia  in her paternal grandfather; Thyroid disease in her maternal aunt. There is no history of Cancer or Breast cancer.  ROS:   Please see the history of present illness.    *** All other systems reviewed and are negative.  EKGs/Labs/Other Studies Reviewed:    The following studies were reviewed today:  Heart monitor 01/15/21 Patient had a min HR of 52 bpm, max HR of 165 bpm, and avg HR of 96 bpm. Predominant underlying rhythm was Sinus Rhythm. Isolated SVEs were rare (<1.0%), SVE Couplets were rare (<1.0%), and SVE Triplets were rare (<1.0%). Isolated VEs were rare (<1.0%), and no VE Couplets or VE Triplets were present.   EKG:  EKG  is *** ordered today.  The ekg ordered today demonstrates ***  Recent Labs: No results found for requested labs within last 8760 hours.  Recent Lipid Panel    Component Value Date/Time   CHOL 150 12/03/2011 1555   TRIG 44 12/03/2011 1555   HDL 59 12/03/2011 1555   CHOLHDL 2.5 12/03/2011 1555   VLDL 9 12/03/2011 1555   LDLCALC 82 12/03/2011 1555     Risk Assessment/Calculations:   {Does this patient have ATRIAL FIBRILLATION?[phone]}   Physical Exam:    VS:  There were no vitals taken for this visit.    Wt Readings from Last 3 Encounters:  01/15/21 182 lb (82.6 kg)  10/07/18 160 lb (72.6 kg)  10/05/18 185 lb (83.9 kg)     GEN: *** Well nourished, well developed in no acute distress HEENT: Normal NECK: No JVD; No carotid bruits LYMPHATICS: No lymphadenopathy CARDIAC: ***RRR, no murmurs, rubs, gallops RESPIRATORY:  Clear to auscultation without rales, wheezing or rhonchi  ABDOMEN: Soft, non-tender, non-distended MUSCULOSKELETAL:  No edema; No deformity  SKIN: Warm and dry NEUROLOGIC:  Alert and oriented x 3 PSYCHIATRIC:  Normal affect   ASSESSMENT:    No diagnosis found. PLAN:    In order of problems listed above:  1. ***  Disposition: Follow up {follow up:15908} with ***   Shared Decision Making/Informed Consent   {Are you ordering a CV Procedure (e.g. stress test, cath, DCCV, TEE, etc)?   Press F2        :[id number]}    Signed, Troi Bechtold Arlyss Repress  02/11/2021 12:38 PM    [facility]

## 2021-02-12 ENCOUNTER — Ambulatory Visit: Payer: BLUE CROSS/BLUE SHIELD | Admitting: Medical

## 2021-02-21 NOTE — Addendum Note (Signed)
Addended by: Valora Corporal on: 02/21/2021 09:49 AM   Modules accepted: Orders

## 2021-03-26 ENCOUNTER — Ambulatory Visit: Payer: BLUE CROSS/BLUE SHIELD | Admitting: Medical

## 2021-12-31 DIAGNOSIS — Z131 Encounter for screening for diabetes mellitus: Secondary | ICD-10-CM | POA: Diagnosis not present

## 2021-12-31 DIAGNOSIS — Z13220 Encounter for screening for lipoid disorders: Secondary | ICD-10-CM | POA: Diagnosis not present

## 2021-12-31 DIAGNOSIS — Z130 Encounter for screening for diseases of the blood and blood-forming organs and certain disorders involving the immune mechanism: Secondary | ICD-10-CM | POA: Diagnosis not present

## 2022-01-14 DIAGNOSIS — R102 Pelvic and perineal pain: Secondary | ICD-10-CM | POA: Diagnosis not present

## 2022-01-14 DIAGNOSIS — Z0000 Encounter for general adult medical examination without abnormal findings: Secondary | ICD-10-CM | POA: Diagnosis not present

## 2022-01-14 DIAGNOSIS — N6342 Unspecified lump in left breast, subareolar: Secondary | ICD-10-CM | POA: Diagnosis not present

## 2022-01-14 DIAGNOSIS — L821 Other seborrheic keratosis: Secondary | ICD-10-CM | POA: Diagnosis not present

## 2022-01-15 ENCOUNTER — Other Ambulatory Visit: Payer: Self-pay | Admitting: Family Medicine

## 2022-01-15 DIAGNOSIS — N63 Unspecified lump in unspecified breast: Secondary | ICD-10-CM

## 2022-02-04 DIAGNOSIS — Z124 Encounter for screening for malignant neoplasm of cervix: Secondary | ICD-10-CM | POA: Diagnosis not present

## 2022-02-04 DIAGNOSIS — N809 Endometriosis, unspecified: Secondary | ICD-10-CM | POA: Diagnosis not present

## 2022-02-04 DIAGNOSIS — R102 Pelvic and perineal pain: Secondary | ICD-10-CM | POA: Diagnosis not present

## 2022-02-04 DIAGNOSIS — G8929 Other chronic pain: Secondary | ICD-10-CM | POA: Diagnosis not present

## 2022-02-11 ENCOUNTER — Ambulatory Visit
Admission: RE | Admit: 2022-02-11 | Discharge: 2022-02-11 | Disposition: A | Discharge status: Home / Self Care | Payer: 59 | Source: Ambulatory Visit | Attending: Family Medicine | Admitting: Family Medicine

## 2022-02-11 DIAGNOSIS — L237 Allergic contact dermatitis due to plants, except food: Secondary | ICD-10-CM | POA: Diagnosis not present

## 2022-02-11 DIAGNOSIS — L578 Other skin changes due to chronic exposure to nonionizing radiation: Secondary | ICD-10-CM | POA: Diagnosis not present

## 2022-02-11 DIAGNOSIS — N63 Unspecified lump in unspecified breast: Secondary | ICD-10-CM

## 2022-02-11 DIAGNOSIS — D225 Melanocytic nevi of trunk: Secondary | ICD-10-CM | POA: Diagnosis not present

## 2022-02-11 DIAGNOSIS — N6320 Unspecified lump in the left breast, unspecified quadrant: Secondary | ICD-10-CM | POA: Diagnosis not present

## 2022-02-11 DIAGNOSIS — D2330 Other benign neoplasm of skin of unspecified part of face: Secondary | ICD-10-CM | POA: Diagnosis not present

## 2022-02-11 DIAGNOSIS — D2361 Other benign neoplasm of skin of right upper limb, including shoulder: Secondary | ICD-10-CM | POA: Diagnosis not present

## 2022-02-11 DIAGNOSIS — D2371 Other benign neoplasm of skin of right lower limb, including hip: Secondary | ICD-10-CM | POA: Diagnosis not present

## 2022-02-11 DIAGNOSIS — R922 Inconclusive mammogram: Secondary | ICD-10-CM | POA: Diagnosis not present

## 2022-02-11 DIAGNOSIS — L821 Other seborrheic keratosis: Secondary | ICD-10-CM | POA: Diagnosis not present

## 2022-02-11 DIAGNOSIS — L700 Acne vulgaris: Secondary | ICD-10-CM | POA: Diagnosis not present

## 2022-03-11 DIAGNOSIS — G8929 Other chronic pain: Secondary | ICD-10-CM | POA: Diagnosis not present

## 2022-03-11 DIAGNOSIS — N809 Endometriosis, unspecified: Secondary | ICD-10-CM | POA: Diagnosis not present

## 2022-03-11 DIAGNOSIS — R102 Pelvic and perineal pain: Secondary | ICD-10-CM | POA: Diagnosis not present

## 2022-04-10 ENCOUNTER — Other Ambulatory Visit: Payer: Self-pay | Admitting: Obstetrics and Gynecology

## 2022-07-22 DIAGNOSIS — G8929 Other chronic pain: Secondary | ICD-10-CM | POA: Diagnosis not present

## 2022-07-22 DIAGNOSIS — R102 Pelvic and perineal pain: Secondary | ICD-10-CM | POA: Diagnosis not present

## 2022-07-28 ENCOUNTER — Other Ambulatory Visit: Payer: Self-pay

## 2022-07-28 ENCOUNTER — Encounter
Admission: RE | Admit: 2022-07-28 | Discharge: 2022-07-28 | Disposition: A | Discharge status: Home / Self Care | Payer: 59 | Source: Ambulatory Visit | Attending: Obstetrics and Gynecology | Admitting: Obstetrics and Gynecology

## 2022-07-28 VITALS — Ht 63.0 in | Wt 180.0 lb

## 2022-07-28 DIAGNOSIS — Z01818 Encounter for other preprocedural examination: Secondary | ICD-10-CM

## 2022-07-28 NOTE — Patient Instructions (Addendum)
Your procedure is scheduled on: 08/06/22 Report to [place]. To find out your arrival time please call [phone] between 1PM - 3PM on 08/05/22 .  Remember: Instructions that are not followed completely may result in serious medical risk, up to and including death, or upon the discretion of your surgeon and anesthesiologist your surgery may need to be rescheduled.     _X__ 1. Do not eat food after midnight the night before your procedure.                 No gum chewing or hard candies. You may drink clear liquids up to 2 hours                 before you are scheduled to arrive for your surgery- DO not drink clear                 liquids within 2 hours of the start of your surgery.                 Clear Liquids include:  water, apple juice without pulp, clear carbohydrate                 drink such as Clearfast or Gatorade, Black Coffee or Tea (Do not add                 anything to coffee or tea). Diabetics water only  Finish the Ensure "clear" pre surgery drink 2 hours prior to your arrival to surgery  __X__2.  On the morning of surgery brush your teeth with toothpaste and water, you                 may rinse your mouth with mouthwash if you wish.  Do not swallow any              toothpaste of mouthwash.     _X__ 3.  No Alcohol for 24 hours before or after surgery.   _X__ 4.  Do Not Smoke or use e-cigarettes For 24 Hours Prior to Your Surgery.                 Do not use any chewable tobacco products for at least 6 hours prior to                 surgery.  ____  5.  Bring all medications with you on the day of surgery if instructed.   __X__  6.  Notify your doctor if there is any change in your medical condition      (cold, fever, infections).     Do not wear jewelry, make-up, hairpins, clips or nail polish. Do not wear lotions, powders, or perfumes. You may wear deododrant. Do not shave body hair 48 hours prior to surgery.  Men may shave face and neck. Do not bring valuables to the hospital.    [facility] is not responsible for any belongings or valuables.  Contacts, dentures/partials or body piercings may not be worn into surgery. Bring a case for your contacts, glasses or hearing aids, a denture cup will be supplied. Leave your suitcase in the car. After surgery it may be brought to your room. For patients admitted to the hospital, discharge time is determined by your treatment team.   Patients discharged the day of surgery will not be allowed to drive home.   Please read over the following fact sheets that you  were given:   MRSA Information, CHG soap, Ensure, Incentive Spirometer  __X__ Take these medicines the morning of surgery with A SIP OF WATER:    1. buPROPion (WELLBUTRIN XL) 150 MG 24 hr tablet   2.   3.   4.  5.  6.  ____ Fleet Enema (as directed)   __X__ Use CHG Soap/SAGE wipes as directed  ____ Use inhalers on the day of surgery  ____ Stop metformin/Janumet/Farxiga 2 days prior to surgery    ____ Take 1/2 of usual insulin dose the night before surgery. No insulin the morning          of surgery.   ____ Stop Blood Thinners Coumadin/Plavix/Xarelto/Pleta/Pradaxa/Eliquis/Effient/Aspirin  on   Or contact your Surgeon, Cardiologist or Medical Doctor regarding  ability to stop your blood thinners  __X__ Stop Anti-inflammatories 7 days before surgery such as Advil, Ibuprofen, Motrin,  BC or Goodies Powder, Naprosyn, Naproxen, Aleve, Aspirin   You may use Tylenol if needed  __X__ Stop all herbals and supplements, fish oil or vitamins  until after surgery.    ____ Bring C-Pap to the hospital.

## 2022-07-28 NOTE — H&P (Signed)
Preoperative History and Physical   [name] is a 46 y.o. G5P4 here for surgical management of chronic pelvic pain.   No significant preoperative concerns.   History of Present Illness: 46 y.o. G90P4 female who has r chronic pelvic pain.   She reports that she is tired of being in pain. She has a long history of established endometriosis.  She has pain with activity (running, jogging, riding a 4-wheeler, etc.).  She states that she has cysts on her ovaries that rupture.  She has pain with day-to-day activity.  She has had 4 surgeries for endometriosis.  She also has interstitial cystitis.  She has had cysts removed during pregnancy.  She has had 3-4 laparoscopies to remove the tissue. She gets some relief of symptoms for a while, then they return.  She has a history of migraine with aura. These occur less frequently. Now they occur about once per year.   Apart from surgery she has not really had medication to help with symptoms (prescription).  She did have two [place] IUDs and neither helped. She had each for 5 years.     Ultrasound from 03/11/2022 Ultrasound demonstrates the following findings Adnexa: no masses seen  Uterus: anteverted with endometrial stripe  6.6 mm Additional: two small fibroids   Pap smear 01/2022: NILM, HPV negative   Proposed surgery: Robot assisted total laparoscopic hysterectomy, bilateral salpingectomy, cystoscopy       Past Medical History:  Diagnosis Date   Allergy     Chicken pox     Chronic pelvic pain in female     Endometriosis     Interstitial cystitis     Migraine     Seasonal allergies     Substance abuse (CMS-HCC)      alcohol 8 years         Past Surgical History:  Procedure Laterality Date   ORIF ANKLE FRACTURE Left 09/29/2015    Dr.Menz   APPENDECTOMY       interstitial cystitis       Laser surgery for endometriosis times 3                         OB History  Gravida Para Term Preterm AB Living  5 4          SAB IAB Ectopic Molar  Multiple Live Births                   # Outcome Date GA Lbr Len/2nd Weight Sex Delivery Anes PTL Lv  5 Gravida                    4 Para                    3 Para                    2 Para                    1 Para                    Patient denies any other pertinent gynecologic issues.          Current Outpatient Medications on File Prior to Visit  Medication Sig Dispense Refill   arginine/glutamine/calcium bmb (ARGININE-GLUTAMINE-CALCIUM HMB ORAL) Take by mouth once daily       buPROPion (WELLBUTRIN XL) 150 MG XL tablet Take 2  tablets (300 mg total) by mouth every morning 60 tablet 11   cyanocobalamin, vitamin B-12, 5,000 mcg Cap Take 5,000 mcg by mouth 2 (two) times daily       ibuprofen (ADVIL,MOTRIN) 200 MG tablet Take 200 mg by mouth continuously as needed.       loratadine-pseudoephedrine (ALAVERT D-12 ALLERGY-SINUS) 5-120 mg ER tablet Take 1 tablet by mouth every 12 (twelve) hours       magnesium glycinate 200 mg tablet Take 400 mg by mouth 2 (two) times daily at lunch and dinner Taking 2 a day       multivitamin tablet Take 1 tablet by mouth once daily.       vitamin D3-vitamin K2, MK4, (K2 PLUS D3) 1,000-100 unit-mcg Tab Take by mouth once daily Taking one a day        No current facility-administered medications on file prior to visit.         Allergies  Allergen Reactions   Penicillin G Unknown   Sulfa (Sulfonamide Antibiotics) Swelling      Swelling of the throat      Social History:   reports that she has been smoking. She has a 20.00 pack-year smoking history. She has never used smokeless tobacco. She reports current alcohol use. She reports that she does not use drugs.        Family History  Problem Relation Age of Onset   Allergies Mother     Hyperlipidemia (Elevated cholesterol) Mother        Review of Systems: Noncontributory   PHYSICAL EXAM: Blood pressure 114/77, pulse 83, weight 82 kg (180 lb 12.8 oz). CONSTITUTIONAL: Well-developed,  well-nourished female in no acute distress.  HENT:  Normocephalic, atraumatic, External right and left ear normal. Oropharynx is clear and moist EYES: Conjunctivae and EOM are normal. Pupils are equal, round, and reactive to light. No scleral icterus.  NECK: Normal range of motion, supple, no masses SKIN: Skin is warm and dry. No rash noted. Not diaphoretic. No erythema. No pallor. [place]: Alert and oriented to person, place, and time. Normal reflexes, muscle tone coordination. No cranial nerve deficit noted. PSYCHIATRIC: Normal mood and affect. Normal behavior. Normal judgment and thought content. CARDIOVASCULAR: Normal heart rate noted, regular rhythm RESPIRATORY: Effort and breath sounds normal, no problems with respiration noted ABDOMEN: Soft, nontender, nondistended. PELVIC: Deferred MUSCULOSKELETAL: Normal range of motion. No edema and no tenderness. 2+ distal pulses.   Labs: Recent Results  No results found for this or any previous visit (from the past 336 hour(s)).     Imaging Studies: No results found.   Assessment:    Patient Active Problem List  Diagnosis   Chronic pelvic pain in female      Plan: Patient will undergo surgical management with the above-noted surgery.   The risks of surgery were discussed in detail with the patient including but not limited to: bleeding which may require transfusion or reoperation; infection which may require antibiotics; injury to surrounding organs which may involve bowel, bladder, ureters ; need for additional procedures including laparoscopy or laparotomy; thromboembolic phenomenon, surgical site problems and other postoperative/anesthesia complications. Likelihood of success in alleviating the patient's condition was discussed. Routine postoperative instructions will be reviewed with the patient and her family in detail after surgery.  The patient concurred with the proposed plan, giving informed written consent for the surgery.   Preoperative prophylactic antibiotics, as indicated, and SCDs ordered on call to the OR.     Return in about 4  weeks (around 08/19/2022) for Post-op incision check.    Attestation Statement:    I personally performed the service. (TP)   Payne, MD  [place] 07/22/2022 9:27 AM

## 2022-07-29 ENCOUNTER — Encounter
Admission: RE | Admit: 2022-07-29 | Discharge: 2022-07-29 | Disposition: A | Discharge status: Home / Self Care | Payer: 59 | Source: Ambulatory Visit | Attending: Obstetrics and Gynecology | Admitting: Obstetrics and Gynecology

## 2022-07-29 DIAGNOSIS — G8929 Other chronic pain: Secondary | ICD-10-CM | POA: Diagnosis not present

## 2022-07-29 DIAGNOSIS — Z01818 Encounter for other preprocedural examination: Secondary | ICD-10-CM | POA: Insufficient documentation

## 2022-07-29 DIAGNOSIS — R102 Pelvic and perineal pain: Secondary | ICD-10-CM | POA: Diagnosis not present

## 2022-07-29 LAB — CBC
HCT: 42.5 % (ref 36.0–46.0)
Hemoglobin: 14.5 g/dL (ref 12.0–15.0)
MCH: 32.2 pg (ref 26.0–34.0)
MCHC: 34.1 g/dL (ref 30.0–36.0)
MCV: 94.4 fL (ref 80.0–100.0)
Platelets: 237 10*3/uL (ref 150–400)
RBC: 4.5 MIL/uL (ref 3.87–5.11)
RDW: 11.7 % (ref 11.5–15.5)
WBC: 3.6 10*3/uL — ABNORMAL LOW (ref 4.0–10.5)
nRBC: 0 % (ref 0.0–0.2)

## 2022-07-29 LAB — TYPE AND SCREEN
ABO/RH(D): O POS
Antibody Screen: NEGATIVE

## 2022-07-30 ENCOUNTER — Other Ambulatory Visit: Payer: Self-pay | Admitting: Family Medicine

## 2022-07-30 DIAGNOSIS — N63 Unspecified lump in unspecified breast: Secondary | ICD-10-CM

## 2022-08-03 MED ORDER — LIDOCAINE HCL (PF) 2 % IJ SOLN
INTRAMUSCULAR | Status: AC
  Filled 2022-08-03: qty 5

## 2022-08-03 MED ORDER — PROPOFOL 1000 MG/100ML IV EMUL
INTRAVENOUS | Status: AC
  Filled 2022-08-03: qty 300

## 2022-08-03 MED ORDER — GLYCOPYRROLATE 0.2 MG/ML IJ SOLN
INTRAMUSCULAR | Status: AC
  Filled 2022-08-03: qty 1

## 2022-08-03 MED ORDER — PHENYLEPHRINE 80 MCG/ML (10ML) SYRINGE FOR IV PUSH (FOR BLOOD PRESSURE SUPPORT)
PREFILLED_SYRINGE | INTRAVENOUS | Status: AC
  Filled 2022-08-03: qty 10

## 2022-08-04 MED ORDER — PROPOFOL 10 MG/ML IV BOLUS
INTRAVENOUS | Status: AC
  Filled 2022-08-04: qty 20

## 2022-08-06 ENCOUNTER — Other Ambulatory Visit: Payer: Self-pay

## 2022-08-06 ENCOUNTER — Ambulatory Visit
Admission: RE | Admit: 2022-08-06 | Discharge: 2022-08-06 | Disposition: A | Discharge status: Home / Self Care | Payer: 59 | Attending: Obstetrics and Gynecology | Admitting: Obstetrics and Gynecology

## 2022-08-06 ENCOUNTER — Ambulatory Visit: Payer: 59 | Admitting: Anesthesiology

## 2022-08-06 ENCOUNTER — Encounter
Admission: RE | Disposition: A | Discharge status: Home / Self Care | Payer: Self-pay | Source: Home / Self Care | Attending: Obstetrics and Gynecology

## 2022-08-06 ENCOUNTER — Encounter: Payer: Self-pay | Admitting: Obstetrics and Gynecology

## 2022-08-06 DIAGNOSIS — N809 Endometriosis, unspecified: Secondary | ICD-10-CM | POA: Diagnosis not present

## 2022-08-06 DIAGNOSIS — R102 Pelvic and perineal pain: Secondary | ICD-10-CM | POA: Insufficient documentation

## 2022-08-06 DIAGNOSIS — R69 Illness, unspecified: Secondary | ICD-10-CM | POA: Diagnosis not present

## 2022-08-06 DIAGNOSIS — F17210 Nicotine dependence, cigarettes, uncomplicated: Secondary | ICD-10-CM | POA: Diagnosis not present

## 2022-08-06 DIAGNOSIS — D259 Leiomyoma of uterus, unspecified: Secondary | ICD-10-CM | POA: Insufficient documentation

## 2022-08-06 DIAGNOSIS — N3010 Interstitial cystitis (chronic) without hematuria: Secondary | ICD-10-CM | POA: Diagnosis not present

## 2022-08-06 DIAGNOSIS — G8929 Other chronic pain: Secondary | ICD-10-CM | POA: Insufficient documentation

## 2022-08-06 DIAGNOSIS — Z01818 Encounter for other preprocedural examination: Secondary | ICD-10-CM

## 2022-08-06 HISTORY — PX: CYSTOSCOPY: SHX5120

## 2022-08-06 HISTORY — PX: ROBOTIC ASSISTED LAPAROSCOPIC HYSTERECTOMY AND SALPINGECTOMY: SHX6379

## 2022-08-06 LAB — ABO/RH: ABO/RH(D): O POS

## 2022-08-06 LAB — POCT PREGNANCY, URINE: Preg Test, Ur: NEGATIVE

## 2022-08-06 SURGERY — XI ROBOTIC ASSISTED LAPAROSCOPIC HYSTERECTOMY AND SALPINGECTOMY
Anesthesia: General | Site: Pelvis

## 2022-08-06 MED ORDER — ONDANSETRON HCL 4 MG/2ML IJ SOLN
INTRAMUSCULAR | Status: AC
  Filled 2022-08-06: qty 2

## 2022-08-06 MED ORDER — POVIDONE-IODINE 10 % EX SWAB: 2.0000 | Freq: Once | CUTANEOUS | Status: DC

## 2022-08-06 MED ORDER — CHLORHEXIDINE GLUCONATE 0.12 % MT SOLN: 15.0000 mL | Freq: Once | OROMUCOSAL | Status: AC

## 2022-08-06 MED ORDER — ACETAMINOPHEN 10 MG/ML IV SOLN
INTRAVENOUS | Status: DC | PRN
  Administered 2022-08-06: 1000 mg via INTRAVENOUS

## 2022-08-06 MED ORDER — DEXAMETHASONE SODIUM PHOSPHATE 10 MG/ML IJ SOLN
INTRAMUSCULAR | Status: AC
  Filled 2022-08-06: qty 1

## 2022-08-06 MED ORDER — PROPOFOL 10 MG/ML IV BOLUS
INTRAVENOUS | Status: AC
  Filled 2022-08-06: qty 20

## 2022-08-06 MED ORDER — FENTANYL CITRATE (PF) 100 MCG/2ML IJ SOLN
INTRAMUSCULAR | Status: AC
  Filled 2022-08-06: qty 2

## 2022-08-06 MED ORDER — ORAL CARE MOUTH RINSE: 15.0000 mL | Freq: Once | OROMUCOSAL | Status: AC

## 2022-08-06 MED ORDER — ROCURONIUM BROMIDE 10 MG/ML (PF) SYRINGE
PREFILLED_SYRINGE | INTRAVENOUS | Status: AC
  Filled 2022-08-06: qty 10

## 2022-08-06 MED ORDER — KETOROLAC TROMETHAMINE 30 MG/ML IJ SOLN
INTRAMUSCULAR | Status: DC | PRN
  Administered 2022-08-06: 15 mg via INTRAVENOUS

## 2022-08-06 MED ORDER — FAMOTIDINE 20 MG PO TABS
ORAL_TABLET | ORAL | Status: AC
  Administered 2022-08-06: 20 mg via ORAL
  Filled 2022-08-06: qty 1

## 2022-08-06 MED ORDER — 0.9 % SODIUM CHLORIDE (POUR BTL) OPTIME
TOPICAL | Status: DC | PRN
  Administered 2022-08-06: 1000 mL

## 2022-08-06 MED ORDER — LACTATED RINGERS IV SOLN: INTRAVENOUS | Status: DC

## 2022-08-06 MED ORDER — FENTANYL CITRATE (PF) 100 MCG/2ML IJ SOLN
INTRAMUSCULAR | Status: AC
  Administered 2022-08-06: 25 ug via INTRAVENOUS
  Filled 2022-08-06: qty 2

## 2022-08-06 MED ORDER — OXYCODONE HCL 5 MG PO TABS
ORAL_TABLET | ORAL | Status: AC
  Filled 2022-08-06: qty 1

## 2022-08-06 MED ORDER — CHLORHEXIDINE GLUCONATE 0.12 % MT SOLN
OROMUCOSAL | Status: AC
  Administered 2022-08-06: 15 mL via OROMUCOSAL
  Filled 2022-08-06: qty 15

## 2022-08-06 MED ORDER — SODIUM CHLORIDE 0.9 % IR SOLN
Status: DC | PRN
  Administered 2022-08-06: 1000 mL

## 2022-08-06 MED ORDER — ROCURONIUM BROMIDE 100 MG/10ML IV SOLN
INTRAVENOUS | Status: DC | PRN
  Administered 2022-08-06 (×2): 20 mg via INTRAVENOUS
  Administered 2022-08-06: 40 mg via INTRAVENOUS

## 2022-08-06 MED ORDER — ONDANSETRON 4 MG PO TBDP: 4.0000 mg | ORAL_TABLET | Freq: Four times a day (QID) | ORAL | 0 refills | Status: AC | PRN

## 2022-08-06 MED ORDER — PHENYLEPHRINE HCL (PRESSORS) 10 MG/ML IV SOLN
INTRAVENOUS | Status: DC | PRN
  Administered 2022-08-06 (×5): 80 ug via INTRAVENOUS

## 2022-08-06 MED ORDER — HEMOSTATIC AGENTS (NO CHARGE) OPTIME
TOPICAL | Status: DC | PRN
  Administered 2022-08-06: 1 via TOPICAL

## 2022-08-06 MED ORDER — LIDOCAINE HCL (CARDIAC) PF 100 MG/5ML IV SOSY
PREFILLED_SYRINGE | INTRAVENOUS | Status: DC | PRN
  Administered 2022-08-06: 80 mg via INTRAVENOUS

## 2022-08-06 MED ORDER — CEFAZOLIN SODIUM-DEXTROSE 2-4 GM/100ML-% IV SOLN
2.0000 g | INTRAVENOUS | Status: AC
  Administered 2022-08-06: 2 g via INTRAVENOUS

## 2022-08-06 MED ORDER — IBUPROFEN 600 MG PO TABS: 600.0000 mg | ORAL_TABLET | Freq: Four times a day (QID) | ORAL | 0 refills | Status: AC

## 2022-08-06 MED ORDER — SUGAMMADEX SODIUM 200 MG/2ML IV SOLN
INTRAVENOUS | Status: DC | PRN
  Administered 2022-08-06: 200 mg via INTRAVENOUS

## 2022-08-06 MED ORDER — OXYCODONE HCL 5 MG PO TABS
5.0000 mg | ORAL_TABLET | Freq: Once | ORAL | Status: AC
  Administered 2022-08-06: 5 mg via ORAL

## 2022-08-06 MED ORDER — ONDANSETRON HCL 4 MG/2ML IJ SOLN: 4.0000 mg | Freq: Once | INTRAMUSCULAR | Status: DC | PRN

## 2022-08-06 MED ORDER — ACETAMINOPHEN 10 MG/ML IV SOLN
INTRAVENOUS | Status: AC
  Filled 2022-08-06: qty 100

## 2022-08-06 MED ORDER — LIDOCAINE HCL (PF) 2 % IJ SOLN
INTRAMUSCULAR | Status: AC
  Filled 2022-08-06: qty 5

## 2022-08-06 MED ORDER — OXYCODONE-ACETAMINOPHEN 5-325 MG PO TABS: 1.0000 | ORAL_TABLET | Freq: Four times a day (QID) | ORAL | 0 refills | Status: AC | PRN

## 2022-08-06 MED ORDER — KETOROLAC TROMETHAMINE 30 MG/ML IJ SOLN
INTRAMUSCULAR | Status: AC
  Filled 2022-08-06: qty 1

## 2022-08-06 MED ORDER — FENTANYL CITRATE (PF) 100 MCG/2ML IJ SOLN
INTRAMUSCULAR | Status: DC | PRN
  Administered 2022-08-06 (×2): 50 ug via INTRAVENOUS

## 2022-08-06 MED ORDER — BUPIVACAINE HCL 0.5 % IJ SOLN
INTRAMUSCULAR | Status: DC | PRN
  Administered 2022-08-06: 16 mL

## 2022-08-06 MED ORDER — ONDANSETRON HCL 4 MG/2ML IJ SOLN
INTRAMUSCULAR | Status: DC | PRN
  Administered 2022-08-06: 4 mg via INTRAVENOUS

## 2022-08-06 MED ORDER — SUCCINYLCHOLINE CHLORIDE 200 MG/10ML IV SOSY
PREFILLED_SYRINGE | INTRAVENOUS | Status: DC | PRN
  Administered 2022-08-06: 100 mg via INTRAVENOUS

## 2022-08-06 MED ORDER — BUPIVACAINE HCL (PF) 0.5 % IJ SOLN
INTRAMUSCULAR | Status: AC
  Filled 2022-08-06: qty 30

## 2022-08-06 MED ORDER — FENTANYL CITRATE (PF) 100 MCG/2ML IJ SOLN
25.0000 ug | INTRAMUSCULAR | Status: DC | PRN
  Administered 2022-08-06 (×3): 25 ug via INTRAVENOUS

## 2022-08-06 MED ORDER — DEXAMETHASONE SODIUM PHOSPHATE 10 MG/ML IJ SOLN
INTRAMUSCULAR | Status: DC | PRN
  Administered 2022-08-06: 10 mg via INTRAVENOUS

## 2022-08-06 MED ORDER — MIDAZOLAM HCL 2 MG/2ML IJ SOLN
INTRAMUSCULAR | Status: DC | PRN
  Administered 2022-08-06: 2 mg via INTRAVENOUS

## 2022-08-06 MED ORDER — MIDAZOLAM HCL 2 MG/2ML IJ SOLN
INTRAMUSCULAR | Status: AC
  Filled 2022-08-06: qty 2

## 2022-08-06 MED ORDER — CEFAZOLIN SODIUM-DEXTROSE 2-4 GM/100ML-% IV SOLN
INTRAVENOUS | Status: AC
  Filled 2022-08-06: qty 100

## 2022-08-06 MED ORDER — FAMOTIDINE 20 MG PO TABS: 20.0000 mg | ORAL_TABLET | Freq: Once | ORAL | Status: AC

## 2022-08-06 MED ORDER — PROPOFOL 10 MG/ML IV BOLUS
INTRAVENOUS | Status: DC | PRN
  Administered 2022-08-06: 150 mg via INTRAVENOUS

## 2022-08-06 SURGICAL SUPPLY — 72 items
ADH SKN CLS APL DERMABOND .7 (GAUZE/BANDAGES/DRESSINGS) ×2
APL SRG 38 LTWT LNG FL B (MISCELLANEOUS) ×2
APPLICATOR ARISTA FLEXITIP XL (MISCELLANEOUS) IMPLANT
BAG DRN RND TRDRP ANRFLXCHMBR (UROLOGICAL SUPPLIES) ×2
BAG URINE DRAIN 2000ML AR STRL (UROLOGICAL SUPPLIES) ×2 IMPLANT
BASIN KIT SINGLE STR (MISCELLANEOUS) ×2 IMPLANT
BLADE SURG SZ11 CARB STEEL (BLADE) ×2 IMPLANT
CANNULA CAP OBTURATR AIRSEAL 8 (CAP) ×2 IMPLANT
CATH FOLEY 2WAY  5CC 16FR (CATHETERS) ×2
CATH FOLEY 2WAY 5CC 16FR (CATHETERS) ×2
CATH URTH 16FR FL 2W BLN LF (CATHETERS) ×2 IMPLANT
COVER TIP SHEARS 8 DVNC (MISCELLANEOUS) ×2 IMPLANT
COVER TIP SHEARS 8MM DA VINCI (MISCELLANEOUS) ×2
DERMABOND ADVANCED .7 DNX12 (GAUZE/BANDAGES/DRESSINGS) ×2 IMPLANT
DRAPE 3/4 80X56 (DRAPES) IMPLANT
DRAPE ARM DVNC X/XI (DISPOSABLE) ×8 IMPLANT
DRAPE DA VINCI XI ARM (DISPOSABLE) ×8
DRAPE ROBOT W/ LEGGING 30X125 (DRAPES) ×2 IMPLANT
DRAPE UNDER BUTTOCK W/FLU (DRAPES) ×2 IMPLANT
ELECT REM PT RETURN 9FT ADLT (ELECTROSURGICAL) ×2
ELECTRODE REM PT RTRN 9FT ADLT (ELECTROSURGICAL) ×2 IMPLANT
GAUZE 4X4 16PLY ~~LOC~~+RFID DBL (SPONGE) ×2 IMPLANT
GLOVE BIO SURGEON STRL SZ7 (GLOVE) ×6 IMPLANT
GLOVE SURG UNDER POLY LF SZ7.5 (GLOVE) ×6 IMPLANT
GOWN STRL REUS W/ TWL LRG LVL3 (GOWN DISPOSABLE) ×6 IMPLANT
GOWN STRL REUS W/ TWL XL LVL3 (GOWN DISPOSABLE) ×2 IMPLANT
GOWN STRL REUS W/TWL LRG LVL3 (GOWN DISPOSABLE) ×10
GOWN STRL REUS W/TWL XL LVL3 (GOWN DISPOSABLE) ×2
HEMOSTAT ARISTA ABSORB 3G PWDR (HEMOSTASIS) IMPLANT
IRRIGATION STRYKERFLOW (MISCELLANEOUS) IMPLANT
IRRIGATOR STRYKERFLOW (MISCELLANEOUS)
IRRIGATOR SUCT 8 DISP DVNC XI (IRRIGATION / IRRIGATOR) IMPLANT
IRRIGATOR SUCTION 8MM XI DISP (IRRIGATION / IRRIGATOR) ×2
IV LACTATED RINGERS 1000ML (IV SOLUTION) ×2 IMPLANT
IV NS 1000ML (IV SOLUTION) ×2
IV NS 1000ML BAXH (IV SOLUTION) ×2 IMPLANT
KIT PINK PAD W/HEAD ARE REST (MISCELLANEOUS) ×2
KIT PINK PAD W/HEAD ARM REST (MISCELLANEOUS) ×2 IMPLANT
KIT TURNOVER CYSTO (KITS) ×2 IMPLANT
LABEL OR SOLS (LABEL) ×2 IMPLANT
MANIFOLD NEPTUNE II (INSTRUMENTS) ×2 IMPLANT
MANIPULATOR UTERINE 4.5 ZUMI (MISCELLANEOUS) ×2 IMPLANT
MANIPULATOR VCARE LG CRV RETR (MISCELLANEOUS) IMPLANT
MANIPULATOR VCARE SML CRV RETR (MISCELLANEOUS) IMPLANT
MANIPULATOR VCARE STD CRV RETR (MISCELLANEOUS) IMPLANT
NEEDLE HYPO 22GX1.5 SAFETY (NEEDLE) ×2 IMPLANT
NS IRRIG 500ML POUR BTL (IV SOLUTION) ×2 IMPLANT
OBTURATOR OPTICAL STANDARD 8MM (TROCAR) ×2
OBTURATOR OPTICAL STND 8 DVNC (TROCAR) ×2
OBTURATOR OPTICALSTD 8 DVNC (TROCAR) ×2 IMPLANT
OCCLUDER COLPOPNEUMO (BALLOONS) IMPLANT
PACK LAP CHOLECYSTECTOMY (MISCELLANEOUS) ×2 IMPLANT
PAD PREP 24X41 OB/GYN DISP (PERSONAL CARE ITEMS) ×2 IMPLANT
SCRUB CHG 4% DYNA-HEX 4OZ (MISCELLANEOUS) ×2 IMPLANT
SEAL CANN UNIV 5-8 DVNC XI (MISCELLANEOUS) ×6 IMPLANT
SEAL XI 5MM-8MM UNIVERSAL (MISCELLANEOUS) ×6
SEALER VESSEL DA VINCI XI (MISCELLANEOUS) ×2
SEALER VESSEL EXT DVNC XI (MISCELLANEOUS) ×2 IMPLANT
SET CYSTO W/LG BORE CLAMP LF (SET/KITS/TRAYS/PACK) ×2 IMPLANT
SET TUBE FILTERED XL AIRSEAL (SET/KITS/TRAYS/PACK) ×2 IMPLANT
SOLUTION ELECTROLUBE (MISCELLANEOUS) ×2 IMPLANT
SPONGE T-LAP 18X18 ~~LOC~~+RFID (SPONGE) IMPLANT
SURGILUBE 2OZ TUBE FLIPTOP (MISCELLANEOUS) ×2 IMPLANT
SUT MNCRL 4-0 (SUTURE) ×2
SUT MNCRL 4-0 27XMFL (SUTURE) ×2
SUT VIC AB 0 CT2 27 (SUTURE) IMPLANT
SUT VLOC 90 S/L VL9 GS22 (SUTURE) ×2 IMPLANT
SUTURE MNCRL 4-0 27XMF (SUTURE) ×2 IMPLANT
SYR 10ML LL (SYRINGE) ×2 IMPLANT
SYR 50ML LL SCALE MARK (SYRINGE) ×2 IMPLANT
TRAP FLUID SMOKE EVACUATOR (MISCELLANEOUS) ×2 IMPLANT
WATER STERILE IRR 500ML POUR (IV SOLUTION) ×2 IMPLANT

## 2022-08-06 NOTE — Anesthesia Preprocedure Evaluation (Signed)
Anesthesia Evaluation  Patient identified by MRN, date of birth, ID band Patient awake    Reviewed: Allergy & Precautions, NPO status , Patient's Chart, lab work & pertinent test results  Airway Mallampati: III  TM Distance: >3 FB Neck ROM: Full    Dental  (+) Teeth Intact   Pulmonary neg pulmonary ROS, COPD, Current Smoker and Patient abstained from smoking.   Pulmonary exam normal breath sounds clear to auscultation       Cardiovascular Exercise Tolerance: Good negative cardio ROS Normal cardiovascular exam Rhythm:Regular Rate:Normal     Neuro/Psych  Headaches  Anxiety Depression    negative neurological ROS  negative psych ROS   GI/Hepatic negative GI ROS, Neg liver ROS,,,  Endo/Other  negative endocrine ROS    Renal/GU negative Renal ROS  negative genitourinary   Musculoskeletal   Abdominal Normal abdominal exam  (+)   Peds negative pediatric ROS (+)  Hematology negative hematology ROS (+)   Anesthesia Other Findings Past Medical History: 10/26/2016: COPD (chronic obstructive pulmonary disease) (HCC) No date: Endometriosis No date: Headache No date: History of echocardiogram     Comment:  a. 2012 Echo: EF 60-65%. No rwma. No date: History of tobacco abuse No date: Interstitial cystitis 10/26/2016: Major depression 2013: MRSA (methicillin resistant staph aureus) culture positive No date: Ovarian cyst No date: Panic attacks No date: Panic disorder No date: PAT (paroxysmal atrial tachycardia) No date: Pyelonephritis 06/08/2009: PYELONEPHRITIS     Comment:  Qualifier: Diagnosis of  By: Sidney Ace   No date: Sinus bradycardia     Comment:  a. during pregnancy w/ first son. No date: Sinus problem     Comment:  Right maxillary (frequent)  Past Surgical History: No date: APPENDECTOMY No date: LAPAROSCOPY     Comment:  x3 09/29/2015: ORIF ANKLE FRACTURE; Left     Comment:  Procedure: OPEN REDUCTION  INTERNAL FIXATION (ORIF) ANKLE              FRACTURE;  Surgeon: Hessie Knows, MD;  Location: [facility]               ORS;  Service: Orthopedics;  Laterality: Left;     Reproductive/Obstetrics negative OB ROS                             Anesthesia Physical Anesthesia Plan  ASA: 2  Anesthesia Plan: General   Post-op Pain Management:    Induction: Intravenous  PONV Risk Score and Plan: Ondansetron and Dexamethasone  Airway Management Planned: Oral ETT  Additional Equipment:   Intra-op Plan:   Post-operative Plan: Extubation in OR  Informed Consent: I have reviewed the patients History and Physical, chart, labs and discussed the procedure including the risks, benefits and alternatives for the proposed anesthesia with the patient or authorized representative who has indicated his/her understanding and acceptance.     Dental Advisory Given  Plan Discussed with: CRNA and Surgeon  Anesthesia Plan Comments:        Anesthesia Quick Evaluation

## 2022-08-06 NOTE — Op Note (Signed)
Operative Note    Name: [name]  Date of Service: 08/06/2022  DOB: October 19, 1975  MRN: [id number]   Pre-Operative Diagnosis:  1) Chronic pelvic pain [R10.2, G89.29] 2) Endometriosis [N80.9]  Post-Operative Diagnosis:  1) Chronic pelvic pain [R10.2, G89.29] 2) Endometriosis [N80.9]  Procedures:  1. Robot assisted Total Laparoscopic Hysterectomy, bilateral salpingectomy  2. Cystoscopy  Primary Surgeon: Prentice Docker, MD   EBL: 150 mL   IVF: 1,000 mL   Urine output: 250 mL  Specimens: Uterus, cervix, and bilateral fallopian tubes  Drains: none  Complications: None   Disposition: PACU   Condition: Stable   Findings:  1) enlarged uterus with apparent fundal fibroid 2) normal appearing ovaries, bilateral fallopian tubes 3) No obvious endometriosis lesions  Procedure Summary:  The patient was taken to the operating room where general anesthesia was administered and found to be adequate. She was placed in the dorsal supine lithotomy position in Star stirrups and prepped and draped in the usual, sterile fashion. After a timeout was called an indwelling catheter was placed in her bladder.  A sterile speculum was placed in her vagina.  The anterior lip of the cervix was grasped with the single-tooth tenaculum.  The cervix was serially dilated to an 11 Pratt dilator.  The large Vcare device was placed in accordance to the manufacturer's recommendations.  The tenaculum and speculum were removed.   Attention was turned to the abdomen where after injection of local anesthetic, an 8 mm infraumbilical incision was made with the scalpel. Entry into the abdomen was obtained via Optiview trocar technique (a blunt entry technique with camera visualization through the obturator upon entry). Verification of entry into the abdomen was obtained using opening pressures. The abdomen was insufflated with CO2. The camera was introduced through the trocar with verification of atraumatic  entry.  Right and left abdominal entry sites were created after injection of local anesthetic about 8 cm lateral to the umbilical port in accordance with the Intuitive manufacturer's recommendations.  An additional port was placed 8 cm lateral to the right abdominal port with verification of clearance above the iliac crest by more than 2 cm.  The port sites were 8 mm.  The intuitive trochars were introduced under intra-abdominal camera visualization without difficulty   The XI robot was docked on the patient's left.  Clearance was verified from the patient's legs.  Through the umbilical port the camera was placed.  Through the port attached to arm 3 the monopolar scissors were placed.  Through the port attached to arm 4 the forced bipolar forceps were was placed.  The vessel sealer was attached to port 1.   A thorough inspection was taken of the pelvis with no obvious endometriosis lesions noted.  Therefore, no lesions were removed.  There was some mild scarring near the right uterosacral ligament in the area where the ligament was to be transected for surgery.  Next, the bilateral ureters were identified and found to be well away from the operative area of interest. The right fallopian tube was grasped at the fimbriated end and was transected using the vessel sealer along the mesosalpinx in a lateral to medial fashion. The vessel sealer was used to transect the right round ligament and the utero-ovarian ligament was transected. Tissue was divided along the right broad ligament to the level of the interior cervical os. Bladder tissue was dissected off the lower uterine segment and cervix without difficulty. The right uterine artery was skeletonized and identified and after ligation  was transected with the Vessel Sealer device. The same procedure was carried out on the left side. The colpotomy was performed using monopolar electrocautery in a circumferential fashion following the KOH ring.  The uterus and  fallopian tubes and cervix were removed through the vagina.   Closure of the vaginal cuff was undertaken using the V-lock stitch in a running fashion.  A single stitch was started from the right apex and run to the center of the vaginal cuff.  A new stitch was started at the left apex and crossed the stitching in the midline for full vaginal cuff closure. All vascular pedicles were inspected and found to be hemostatic.  Arista 3 g was added along all vascular pedicle lines to ensure ongoing hemostasis. All instruments removed from the robotic ports.  The robot was undocked from the patient.  The abdomen was then desufflated of CO2 with the aid of 5 deep breaths from anesthesia.  All trochars were then removed.  All skin incisions were closed using 4-0 Vicryl in a subcuticular fashion and reinforced using surgical skin glue.   Cystoscopy was undertaken at this point. The Foley catheter was removed and the 30 cystoscope was gently introduced through the urethra. The bladder survey was undertaken with efflux of urine from both orifices noted. There were no defects noted in the bladder wall. The cystoscope was utilized to fully empty the bladder.  A digital sweep of the vagina was undertaken to ensure no instruments or sponges remained and this was verified to be clear.  The patient tolerated the procedure well.  Sponge, lap, needle, and instrument counts were correct x 2.  VTE prophylaxis: SCDs. Antibiotic prophylaxis: Ancef 2 grams IV prior to skin incision. She was awakened in the operating room and was taken to the PACU in stable condition.   Prentice Docker, MD 08/06/2022 10:16 AM

## 2022-08-06 NOTE — Anesthesia Procedure Notes (Signed)
Procedure Name: Intubation Date/Time: 08/06/2022 7:43 AM  Performed by: Jerrye Noble, CRNAPre-anesthesia Checklist: Patient identified, Emergency Drugs available, Suction available and Patient being monitored Patient Re-evaluated:Patient Re-evaluated prior to induction Oxygen Delivery Method: Circle system utilized Preoxygenation: Pre-oxygenation with 100% oxygen Induction Type: IV induction and Rapid sequence Laryngoscope Size: Glidescope and 3 Grade View: Grade I Tube type: Oral Tube size: 6.5 mm Number of attempts: 1 Airway Equipment and Method: Stylet and Video-laryngoscopy Placement Confirmation: ETT inserted through vocal cords under direct vision, positive ETCO2 and breath sounds checked- equal and bilateral Secured at: 21 cm Tube secured with: Tape Dental Injury: Teeth and Oropharynx as per pre-operative assessment

## 2022-08-06 NOTE — Interval H&P Note (Signed)
History and Physical Interval Note:  08/06/2022 7:21 AM  [name]  has presented today for surgery, with the diagnosis of chronic pelvic pain, endometriosis.  The various methods of treatment have been discussed with the patient and family. After consideration of risks, benefits and other options for treatment, the patient has consented to  Procedure(s): XI ROBOTIC ASSISTED LAPAROSCOPIC HYSTERECTOMY AND SALPINGECTOMY (Bilateral) CYSTOSCOPY (N/A) and possible removal of abnormal tissue, including an ovary, as a surgical intervention.  The patient's history has been reviewed, patient examined, no change in status, stable for surgery.  I have reviewed the patient's chart and labs.  Questions were answered to the patient's satisfaction.    Prentice Docker, MD, [facility] 08/06/2022 7:22 AM

## 2022-08-06 NOTE — Transfer of Care (Signed)
Immediate Anesthesia Transfer of Care Note  Patient: [name]  Procedure(s) Performed: XI ROBOTIC ASSISTED LAPAROSCOPIC HYSTERECTOMY AND SALPINGECTOMY (Bilateral: Pelvis) CYSTOSCOPY  Patient Location: PACU  Anesthesia Type:General  Level of Consciousness: awake, drowsy, and patient cooperative  Airway & Oxygen Therapy: Patient Spontanous Breathing and Patient connected to nasal cannula oxygen  Post-op Assessment: Report given to RN and Post -op Vital signs reviewed and stable  Post vital signs: Reviewed and stable  Last Vitals:  Vitals Value Taken Time  BP 120/53 08/06/22 1024  Temp    Pulse 97 08/06/22 1027  Resp 19 08/06/22 1027  SpO2 100 % 08/06/22 1027  Vitals shown include unvalidated device data.  Last Pain:  Vitals:   08/06/22 0709  TempSrc: Oral  PainSc: 3          Complications: No notable events documented.

## 2022-08-06 NOTE — Anesthesia Postprocedure Evaluation (Signed)
Anesthesia Post Note  Patient: [name]  Procedure(s) Performed: XI ROBOTIC ASSISTED LAPAROSCOPIC HYSTERECTOMY AND SALPINGECTOMY (Bilateral: Pelvis) CYSTOSCOPY  Patient location during evaluation: PACU Anesthesia Type: General Level of consciousness: awake and awake and alert Pain management: satisfactory to patient Vital Signs Assessment: post-procedure vital signs reviewed and stable Respiratory status: spontaneous breathing and nonlabored ventilation Cardiovascular status: stable Anesthetic complications: no  No notable events documented.   Last Vitals:  Vitals:   08/06/22 1024 08/06/22 1030  BP: (!) 120/53 116/72  Pulse: 91 84  Resp: 12 14  Temp: 36.8 C   SpO2: 100% 100%    Last Pain:  Vitals:   08/06/22 1030  TempSrc:   PainSc: 8                  VAN STAVEREN,Konnor Jorden

## 2022-08-06 NOTE — Discharge Instructions (Signed)
AMBULATORY SURGERY  ?DISCHARGE INSTRUCTIONS ? ? ?The drugs that you were given will stay in your system until tomorrow so for the next 24 hours you should not: ? ?Drive an automobile ?Make any legal decisions ?Drink any alcoholic beverage ? ? ?You may resume regular meals tomorrow.  Today it is better to start with liquids and gradually work up to solid foods. ? ?You may eat anything you prefer, but it is better to start with liquids, then soup and crackers, and gradually work up to solid foods. ? ? ?Please notify your doctor immediately if you have any unusual bleeding, trouble breathing, redness and pain at the surgery site, drainage, fever, or pain not relieved by medication. ? ? ? ?Additional Instructions: ? ? ? ?Please contact your physician with any problems or Same Day Surgery at [phone], Monday through Friday 6 am to 4 pm, or [place] [facility] [place] Main number at [phone].  ?

## 2022-08-07 LAB — SURGICAL PATHOLOGY

## 2022-08-20 DIAGNOSIS — Z1211 Encounter for screening for malignant neoplasm of colon: Secondary | ICD-10-CM | POA: Diagnosis not present

## 2022-08-20 DIAGNOSIS — Z01818 Encounter for other preprocedural examination: Secondary | ICD-10-CM | POA: Diagnosis not present

## 2022-08-24 DIAGNOSIS — Z09 Encounter for follow-up examination after completed treatment for conditions other than malignant neoplasm: Secondary | ICD-10-CM | POA: Diagnosis not present

## 2022-08-26 ENCOUNTER — Encounter: Payer: Self-pay | Admitting: Radiology

## 2022-08-26 ENCOUNTER — Ambulatory Visit
Admission: RE | Admit: 2022-08-26 | Discharge: 2022-08-26 | Disposition: A | Discharge status: Home / Self Care | Payer: 59 | Source: Ambulatory Visit | Attending: Family Medicine | Admitting: Family Medicine

## 2022-08-26 DIAGNOSIS — N63 Unspecified lump in unspecified breast: Secondary | ICD-10-CM | POA: Diagnosis not present

## 2022-08-26 DIAGNOSIS — R92332 Mammographic heterogeneous density, left breast: Secondary | ICD-10-CM | POA: Diagnosis not present

## 2022-08-26 DIAGNOSIS — N6012 Diffuse cystic mastopathy of left breast: Secondary | ICD-10-CM | POA: Diagnosis not present

## 2022-09-25 ENCOUNTER — Encounter: Payer: Self-pay | Admitting: Obstetrics and Gynecology

## 2022-11-24 MED ORDER — PENTAFLUOROPROP-TETRAFLUOROETH EX AERO
INHALATION_SPRAY | CUTANEOUS | Status: AC
  Filled 2022-11-24: qty 30

## 2022-11-26 ENCOUNTER — Encounter: Payer: Self-pay | Admitting: *Deleted

## 2022-11-27 ENCOUNTER — Ambulatory Visit: Payer: 59 | Admitting: Registered Nurse

## 2022-11-27 ENCOUNTER — Encounter
Admission: RE | Disposition: A | Discharge status: Home / Self Care | Payer: Self-pay | Source: Ambulatory Visit | Attending: Gastroenterology

## 2022-11-27 ENCOUNTER — Ambulatory Visit
Admission: RE | Admit: 2022-11-27 | Discharge: 2022-11-27 | Disposition: A | Discharge status: Home / Self Care | Payer: 59 | Source: Ambulatory Visit | Attending: Gastroenterology | Admitting: Gastroenterology

## 2022-11-27 ENCOUNTER — Encounter: Payer: Self-pay | Admitting: *Deleted

## 2022-11-27 DIAGNOSIS — N3010 Interstitial cystitis (chronic) without hematuria: Secondary | ICD-10-CM | POA: Insufficient documentation

## 2022-11-27 DIAGNOSIS — Z1211 Encounter for screening for malignant neoplasm of colon: Secondary | ICD-10-CM | POA: Diagnosis not present

## 2022-11-27 DIAGNOSIS — F17200 Nicotine dependence, unspecified, uncomplicated: Secondary | ICD-10-CM | POA: Insufficient documentation

## 2022-11-27 DIAGNOSIS — J449 Chronic obstructive pulmonary disease, unspecified: Secondary | ICD-10-CM | POA: Diagnosis not present

## 2022-11-27 DIAGNOSIS — F418 Other specified anxiety disorders: Secondary | ICD-10-CM | POA: Diagnosis not present

## 2022-11-27 DIAGNOSIS — F17210 Nicotine dependence, cigarettes, uncomplicated: Secondary | ICD-10-CM | POA: Diagnosis not present

## 2022-11-27 HISTORY — PX: COLONOSCOPY WITH PROPOFOL: SHX5780

## 2022-11-27 SURGERY — COLONOSCOPY WITH PROPOFOL
Anesthesia: General

## 2022-11-27 MED ORDER — SODIUM CHLORIDE 0.9 % IV SOLN: INTRAVENOUS | Status: DC

## 2022-11-27 MED ORDER — PROPOFOL 10 MG/ML IV BOLUS
INTRAVENOUS | Status: DC | PRN
  Administered 2022-11-27: 130 mg via INTRAVENOUS

## 2022-11-27 MED ORDER — PROPOFOL 500 MG/50ML IV EMUL
INTRAVENOUS | Status: DC | PRN
  Administered 2022-11-27: 223.048 ug/kg/min via INTRAVENOUS

## 2022-11-27 MED ORDER — DEXMEDETOMIDINE HCL 200 MCG/2ML IV SOLN
INTRAVENOUS | Status: DC | PRN
  Administered 2022-11-27: 12 ug via INTRAVENOUS

## 2022-11-27 MED ORDER — LIDOCAINE HCL (CARDIAC) PF 100 MG/5ML IV SOSY
PREFILLED_SYRINGE | INTRAVENOUS | Status: DC | PRN
  Administered 2022-11-27: 100 mg via INTRAVENOUS

## 2022-11-27 NOTE — Anesthesia Postprocedure Evaluation (Signed)
Anesthesia Post Note  Patient: [name]  Procedure(s) Performed: COLONOSCOPY WITH PROPOFOL  Patient location during evaluation: Endoscopy Anesthesia Type: General Level of consciousness: awake and alert Pain management: pain level controlled Vital Signs Assessment: post-procedure vital signs reviewed and stable Respiratory status: spontaneous breathing, nonlabored ventilation, respiratory function stable and patient connected to nasal cannula oxygen Cardiovascular status: blood pressure returned to baseline and stable Postop Assessment: no apparent nausea or vomiting Anesthetic complications: no   No notable events documented.   Last Vitals:  Vitals:   11/27/22 1307 11/27/22 1314  BP: (!) 102/58 (!) 122/59  Pulse: (!) 108 99  Resp: (!) 21 17  Temp:    SpO2: 98% 99%    Last Pain:  Vitals:   11/27/22 1314  TempSrc:   PainSc: 0-No pain                 Arita Miss

## 2022-11-27 NOTE — Transfer of Care (Signed)
Immediate Anesthesia Transfer of Care Note  Patient: MAYVA VOCK  Procedure(s) Performed: COLONOSCOPY WITH PROPOFOL  Patient Location: Endoscopy Unit  Anesthesia Type:General  Level of Consciousness: drowsy  Airway & Oxygen Therapy: Patient Spontanous Breathing  Post-op Assessment: Report given to RN and Post -op Vital signs reviewed and stable  Post vital signs: Reviewed and stable  Last Vitals:  Vitals Value Taken Time  BP 80/47 11/27/22 1304  Temp    Pulse 108 11/27/22 1306  Resp 21 11/27/22 1306  SpO2 98 % 11/27/22 1306  Vitals shown include unvalidated device data.  Last Pain:  Vitals:   11/27/22 1304  TempSrc:   PainSc: 0-No pain         Complications: No notable events documented.

## 2022-11-27 NOTE — Op Note (Signed)
[facility] Patient Name: [name] Procedure Date: 11/27/2022 12:27 PM MRN: [id number] Account #: [id number] Date of Birth: [birth year] Admit Type: Outpatient Age: 47 Room: [facility] ENDO ROOM 1 Gender: Female Note Status: Finalized Instrument Name: Park Meo [id number] Procedure:             Colonoscopy Indications:           Screening for colorectal malignant neoplasm Providers:             Andrey Farmer MD, MD Medicines:             Monitored Anesthesia Care Complications:         No immediate complications. Procedure:             Pre-Anesthesia Assessment:                        - Prior to the procedure, a History and Physical was                         performed, and patient medications and allergies were                         reviewed. The patient is competent. The risks and                         benefits of the procedure and the sedation options and                         risks were discussed with the patient. All questions                         were answered and informed consent was obtained.                         Patient identification and proposed procedure were                         verified by the physician, the nurse, the                         anesthesiologist, the anesthetist and the technician                         in the endoscopy suite. Mental Status Examination:                         alert and oriented. Airway Examination: normal                         oropharyngeal airway and neck mobility. Respiratory                         Examination: clear to auscultation. CV Examination:                         normal. Prophylactic Antibiotics: The patient does not                         require prophylactic antibiotics. Prior  Anticoagulants: The patient has taken no anticoagulant                         or antiplatelet agents. ASA Grade Assessment: II - A                         patient with  mild systemic disease. After reviewing                         the risks and benefits, the patient was deemed in                         satisfactory condition to undergo the procedure. The                         anesthesia plan was to use monitored anesthesia care                         (MAC). Immediately prior to administration of                         medications, the patient was re-assessed for adequacy                         to receive sedatives. The heart rate, respiratory                         rate, oxygen saturations, blood pressure, adequacy of                         pulmonary ventilation, and response to care were                         monitored throughout the procedure. The physical                         status of the patient was re-assessed after the                         procedure.                        After obtaining informed consent, the colonoscope was                         passed under direct vision. Throughout the procedure,                         the patient's blood pressure, pulse, and oxygen                         saturations were monitored continuously. The                         Colonoscope was introduced through the anus and                         advanced to the the terminal ileum. The colonoscopy  was performed without difficulty. The patient                         tolerated the procedure well. The quality of the bowel                         preparation was good. The terminal ileum, ileocecal                         valve, appendiceal orifice, and rectum were                         photographed. Findings:      The perianal and digital rectal examinations were normal.      The terminal ileum appeared normal.      The entire examined colon appeared normal on direct and retroflexion       views. Impression:            - The examined portion of the ileum was normal.                        - The entire examined colon is  normal on direct and                         retroflexion views.                        - No specimens collected. Recommendation:        - Discharge patient to home.                        - Resume previous diet.                        - Continue present medications.                        - Repeat colonoscopy in 10 years for screening                         purposes.                        - Return to referring physician as previously                         scheduled. Procedure Code(s):     --- Professional ---                        [id number], Colorectal cancer screening; colonoscopy on                         individual not meeting criteria for high risk Diagnosis Code(s):     --- Professional ---                        Z12.11, Encounter for screening for malignant neoplasm                         of colon CPT copyright 2022 [employer]. All rights reserved.  The codes documented in this report are preliminary and upon coder review may  be revised to meet current compliance requirements. Andrey Farmer MD, MD 11/27/2022 1:04:07 PM Number of Addenda: 0 Note Initiated On: 11/27/2022 12:27 PM Scope Withdrawal Time: 0 hours 7 minutes 52 seconds  Total Procedure Duration: 0 hours 12 minutes 16 seconds  Estimated Blood Loss:  Estimated blood loss: none.      [facility]

## 2022-11-27 NOTE — Anesthesia Preprocedure Evaluation (Signed)
Anesthesia Evaluation  Patient identified by MRN, date of birth, ID band Patient awake    Reviewed: Allergy & Precautions, NPO status , Patient's Chart, lab work & pertinent test results  History of Anesthesia Complications Negative for: history of anesthetic complications  Airway Mallampati: IV  TM Distance: >3 FB Neck ROM: Full    Dental no notable dental hx. (+) Teeth Intact   Pulmonary neg sleep apnea, COPD, Current Smoker and Patient abstained from smoking.   Pulmonary exam normal breath sounds clear to auscultation       Cardiovascular Exercise Tolerance: Good METS(-) hypertension(-) CAD and (-) Past MI negative cardio ROS (-) dysrhythmias  Rhythm:Regular Rate:Normal - Systolic murmurs    Neuro/Psych  Headaches PSYCHIATRIC DISORDERS Anxiety Depression       GI/Hepatic ,neg GERD  ,,(+)     (-) substance abuse    Endo/Other  neg diabetes    Renal/GU negative Renal ROS     Musculoskeletal   Abdominal   Peds  Hematology   Anesthesia Other Findings Past Medical History: 10/26/2016: COPD (chronic obstructive pulmonary disease) ([place]) No date: Endometriosis No date: Headache No date: History of echocardiogram     Comment:  a. 2012 Echo: EF 60-65%. No rwma. No date: History of tobacco abuse No date: Interstitial cystitis 10/26/2016: Major depression 2013: MRSA (methicillin resistant staph aureus) culture positive No date: Ovarian cyst No date: Panic attacks No date: Panic disorder No date: PAT (paroxysmal atrial tachycardia) No date: Pyelonephritis 06/08/2009: PYELONEPHRITIS     Comment:  Qualifier: Diagnosis of  By: Sidney Ace   No date: Sinus bradycardia     Comment:  a. during pregnancy w/ first son. No date: Sinus problem     Comment:  Right maxillary (frequent)  Reproductive/Obstetrics                              Anesthesia Physical Anesthesia Plan  ASA:  2  Anesthesia Plan: General   Post-op Pain Management: Minimal or no pain anticipated   Induction: Intravenous  PONV Risk Score and Plan: 2 and Propofol infusion, TIVA and Ondansetron  Airway Management Planned: Nasal Cannula  Additional Equipment: None  Intra-op Plan:   Post-operative Plan:   Informed Consent: I have reviewed the patients History and Physical, chart, labs and discussed the procedure including the risks, benefits and alternatives for the proposed anesthesia with the patient or authorized representative who has indicated his/her understanding and acceptance.     Dental advisory given  Plan Discussed with: CRNA and Surgeon  Anesthesia Plan Comments: (Discussed risks of anesthesia with patient, including possibility of difficulty with spontaneous ventilation under anesthesia necessitating airway intervention, PONV, and rare risks such as cardiac or respiratory or neurological events, and allergic reactions. Discussed the role of CRNA in patient's perioperative care. Patient understands. Patient counseled on benefits of smoking cessation, and increased perioperative risks associated with continued smoking. )         Anesthesia Quick Evaluation

## 2022-11-27 NOTE — H&P (Signed)
Outpatient short stay form Pre-procedure 11/27/2022  Sarah Rubenstein, MD  Primary Physician: Ellene Route  Reason for visit:  Screening  History of present illness:    47 y/o lady here for index screening colonoscopy. Had recent hysterectomy. History of endometriosis and interstitial cystitis. No blood thinners. No family history of GI malignancies.    Current Facility-Administered Medications:    0.9 %  sodium chloride infusion, , Intravenous, Continuous, Judith Campillo, Hilton Cork, MD, Last Rate: 20 mL/hr at 11/27/22 1223, New Bag at 11/27/22 1223  Medications Prior to Admission  Medication Sig Dispense Refill Last Dose   buPROPion (WELLBUTRIN XL) 150 MG 24 hr tablet Take 150 mg by mouth 2 (two) times daily.   11/26/2022   ibuprofen (ADVIL) 600 MG tablet Take 1 tablet (600 mg total) by mouth every 6 (six) hours. 30 tablet 0    ondansetron (ZOFRAN-ODT) 4 MG disintegrating tablet Take 1 tablet (4 mg total) by mouth every 6 (six) hours as needed for nausea. 20 tablet 0      Allergies  Allergen Reactions   Sulfonamide Derivatives Anaphylaxis   Other     Red food Coloring   Penicillins Other (See Comments)    Unknown reactions from PT     Past Medical History:  Diagnosis Date   COPD (chronic obstructive pulmonary disease) ([place]) 10/26/2016   Endometriosis    Headache    History of echocardiogram    a. 2012 Echo: EF 60-65%. No rwma.   History of tobacco abuse    Interstitial cystitis    Major depression 10/26/2016   MRSA (methicillin resistant staph aureus) culture positive 2013   Ovarian cyst    Panic attacks    Panic disorder    PAT (paroxysmal atrial tachycardia)    Pyelonephritis    PYELONEPHRITIS 06/08/2009   Qualifier: Diagnosis of  By: Sidney Ace     Sinus bradycardia    a. during pregnancy w/ first son.   Sinus problem    Right maxillary (frequent)    Review of systems:  Otherwise negative.    Physical Exam  Gen: Alert, oriented. Appears stated  age.  HEENT: PERRLA. Lungs: No respiratory distress CV: RRR Abd: soft, benign, no masses Ext: No edema    Planned procedures: Proceed with colonoscopy. The patient understands the nature of the planned procedure, indications, risks, alternatives and potential complications including but not limited to bleeding, infection, perforation, damage to internal organs and possible oversedation/side effects from anesthesia. The patient agrees and gives consent to proceed.  Please refer to procedure notes for findings, recommendations and patient disposition/instructions.     Sarah Rubenstein, MD [facility]

## 2022-11-27 NOTE — Interval H&P Note (Signed)
History and Physical Interval Note:  11/27/2022 12:41 PM  [name]  has presented today for surgery, with the diagnosis of colon cancer screening.  The various methods of treatment have been discussed with the patient and family. After consideration of risks, benefits and other options for treatment, the patient has consented to  Procedure(s): COLONOSCOPY WITH PROPOFOL (N/A) as a surgical intervention.  The patient's history has been reviewed, patient examined, no change in status, stable for surgery.  I have reviewed the patient's chart and labs.  Questions were answered to the patient's satisfaction.     Lesly Rubenstein  Ok to proceed with colonoscopy

## 2022-11-30 ENCOUNTER — Encounter: Payer: Self-pay | Admitting: Gastroenterology

## 2023-01-13 DIAGNOSIS — Z131 Encounter for screening for diabetes mellitus: Secondary | ICD-10-CM | POA: Diagnosis not present

## 2023-01-13 DIAGNOSIS — Z13220 Encounter for screening for lipoid disorders: Secondary | ICD-10-CM | POA: Diagnosis not present

## 2023-01-13 DIAGNOSIS — Z130 Encounter for screening for diseases of the blood and blood-forming organs and certain disorders involving the immune mechanism: Secondary | ICD-10-CM | POA: Diagnosis not present

## 2023-01-20 DIAGNOSIS — Z131 Encounter for screening for diabetes mellitus: Secondary | ICD-10-CM | POA: Diagnosis not present

## 2023-01-20 DIAGNOSIS — Z13220 Encounter for screening for lipoid disorders: Secondary | ICD-10-CM | POA: Diagnosis not present

## 2023-01-20 DIAGNOSIS — R002 Palpitations: Secondary | ICD-10-CM | POA: Diagnosis not present

## 2023-01-20 DIAGNOSIS — Z0000 Encounter for general adult medical examination without abnormal findings: Secondary | ICD-10-CM | POA: Diagnosis not present

## 2023-01-20 DIAGNOSIS — M2550 Pain in unspecified joint: Secondary | ICD-10-CM | POA: Diagnosis not present

## 2023-01-20 DIAGNOSIS — E538 Deficiency of other specified B group vitamins: Secondary | ICD-10-CM | POA: Diagnosis not present

## 2023-01-20 DIAGNOSIS — Z130 Encounter for screening for diseases of the blood and blood-forming organs and certain disorders involving the immune mechanism: Secondary | ICD-10-CM | POA: Diagnosis not present

## 2023-01-20 DIAGNOSIS — R5383 Other fatigue: Secondary | ICD-10-CM | POA: Diagnosis not present

## 2023-01-20 DIAGNOSIS — F330 Major depressive disorder, recurrent, mild: Secondary | ICD-10-CM | POA: Diagnosis not present
